# Patient Record
Sex: Male | Born: 1960 | Race: White | Hispanic: No | Marital: Married | Smoking: Never smoker
Health system: Southern US, Community
[De-identification: ages and names within clinical notes are randomized; demographics above are authoritative.]

## PROBLEM LIST (undated history)

## (undated) DIAGNOSIS — F319 Bipolar disorder, unspecified: Secondary | ICD-10-CM

## (undated) HISTORY — PX: FINGER SURGERY: SHX640

---

## 2004-11-02 ENCOUNTER — Emergency Department (HOSPITAL_COMMUNITY): Admission: EM | Admit: 2004-11-02 | Discharge: 2004-11-02 | Payer: Self-pay | Admitting: *Deleted

## 2004-11-02 IMAGING — CR DG FINGER THUMB 2+V*L*
1 series · 1 of 1 positions shown · non-contrast
Comparison: none

CLINICAL DATA: Football injury; thumb pain
 LEFT THUMB THREE VIEW:
 There is a comminuted fracture noted through the proximal phalanx of the left thumb.  Fracture does enter/involve the MCP joint.  Mild displacement of fracture fragments.

[view not recorded]
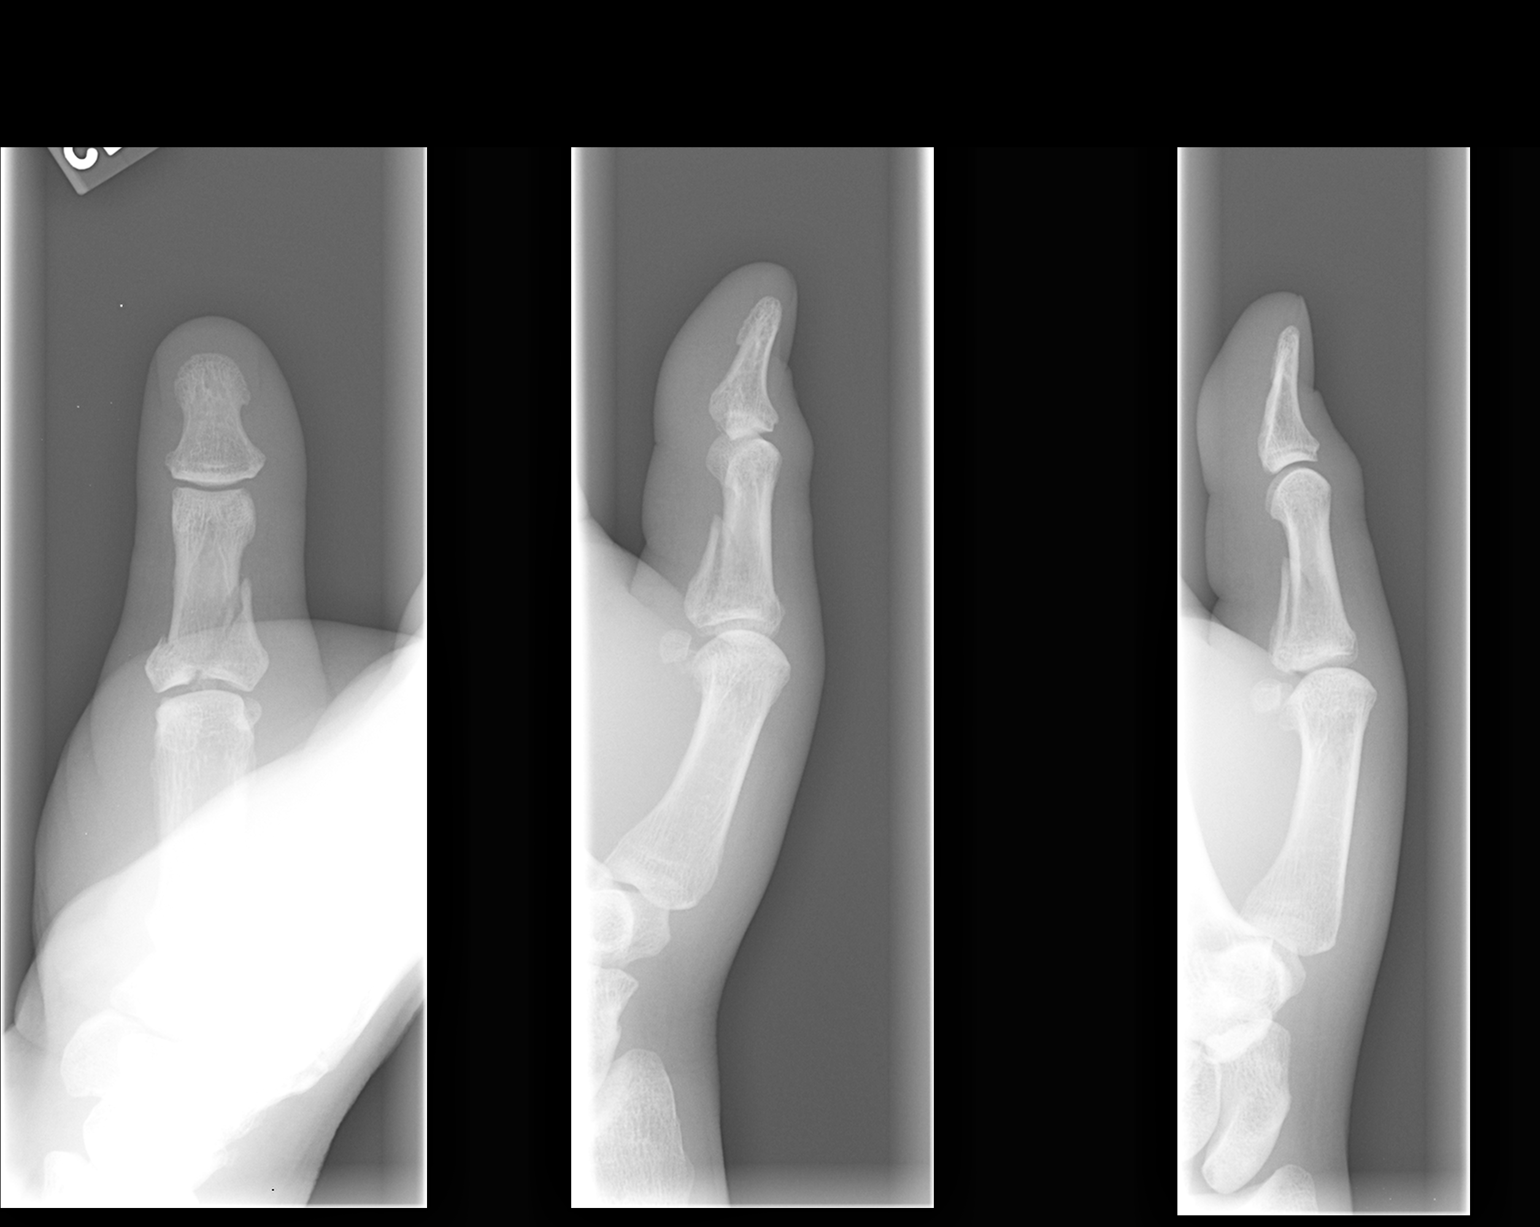

[1 of 1 positions shown; findings below may reference images not displayed]

IMPRESSION: Mildly displaced comminuted fracture proximal phalanx left thumb, entering/involving MCP joint.

## 2013-08-02 ENCOUNTER — Other Ambulatory Visit: Payer: Self-pay | Admitting: Physician Assistant

## 2013-08-02 DIAGNOSIS — N50812 Left testicular pain: Secondary | ICD-10-CM

## 2013-08-03 ENCOUNTER — Ambulatory Visit
Admission: RE | Admit: 2013-08-03 | Discharge: 2013-08-03 | Disposition: A | Discharge status: Home / Self Care | Payer: BC Managed Care – PPO | Source: Ambulatory Visit | Attending: Physician Assistant | Admitting: Physician Assistant

## 2013-08-03 DIAGNOSIS — N50812 Left testicular pain: Secondary | ICD-10-CM

## 2013-08-03 IMAGING — US US ART/VEN ABD/PELV/SCROTUM DOPPLER LTD
1 series · 14 of 25 positions shown · non-contrast
Comparison: None

CLINICAL DATA: Left testicular pain intermittently, no trauma

SCROTAL ULTRASOUND
DOPPLER ULTRASOUND OF THE TESTICLES
TECHNIQUE: Complete ultrasound examination of the testicles,
epididymis, and other scrotal structures was performed.  Color and
spectral Doppler ultrasound were also utilized to evaluate blood
flow to the testicles.

[Series 1: us art/ven abd/pelv/scrotum doppler ltd · 0.07mm/px · 14 of 48 slices shown]
[im 1/48]
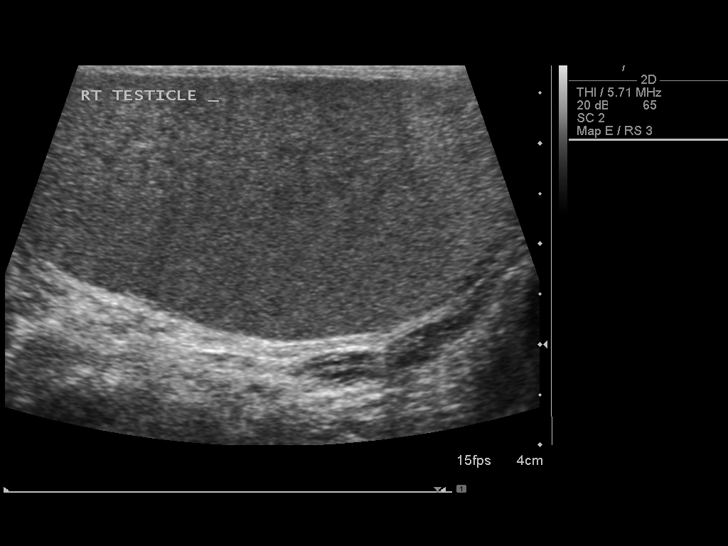
[im 4/48]
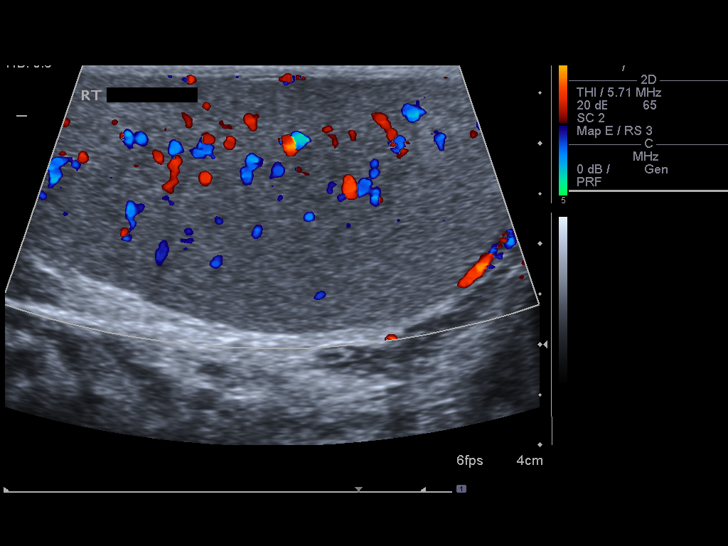
[im 8/48]
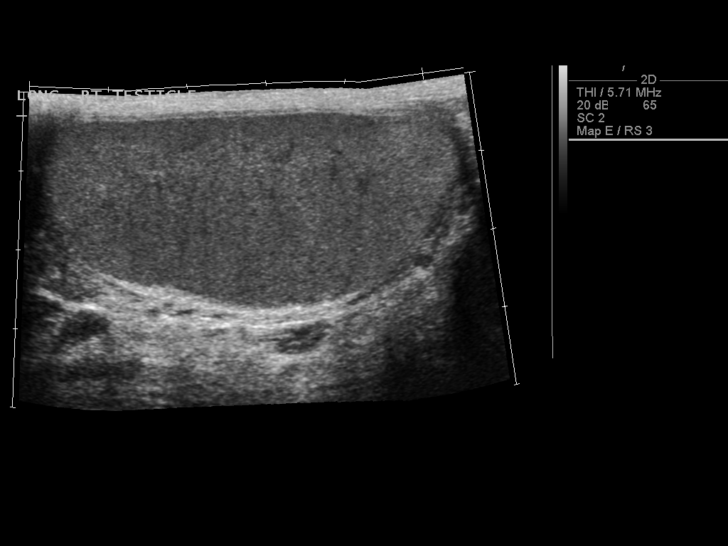
[im 12/48]
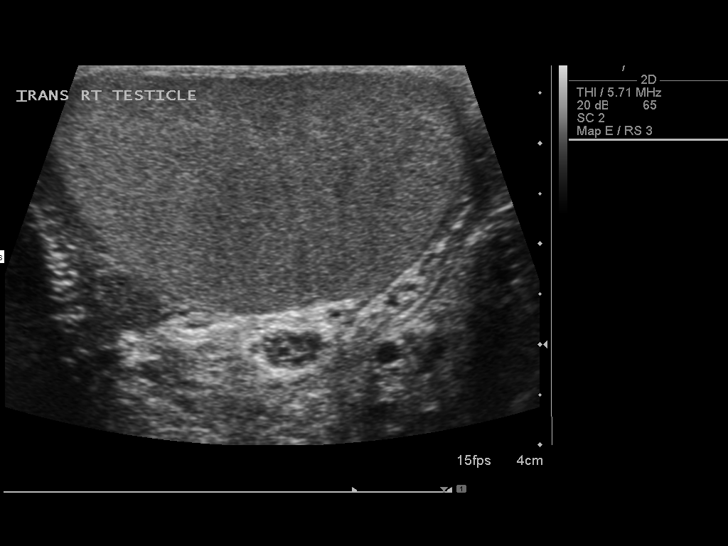
[im 16/48]
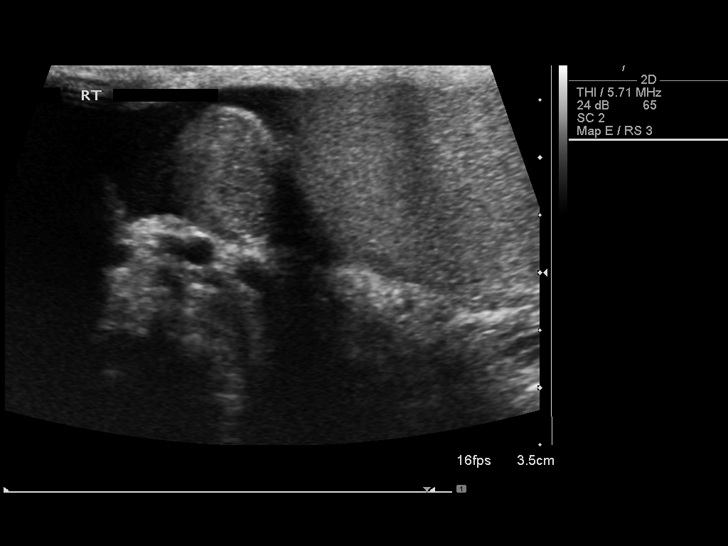
[im 18/48]
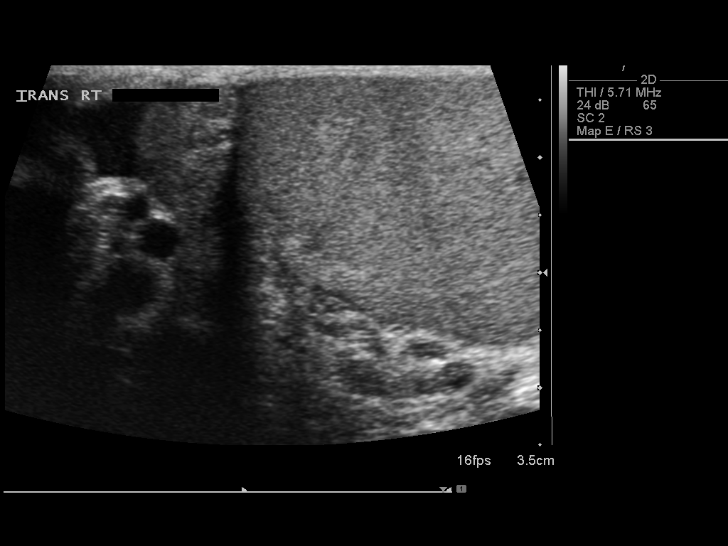
[im 22/48]
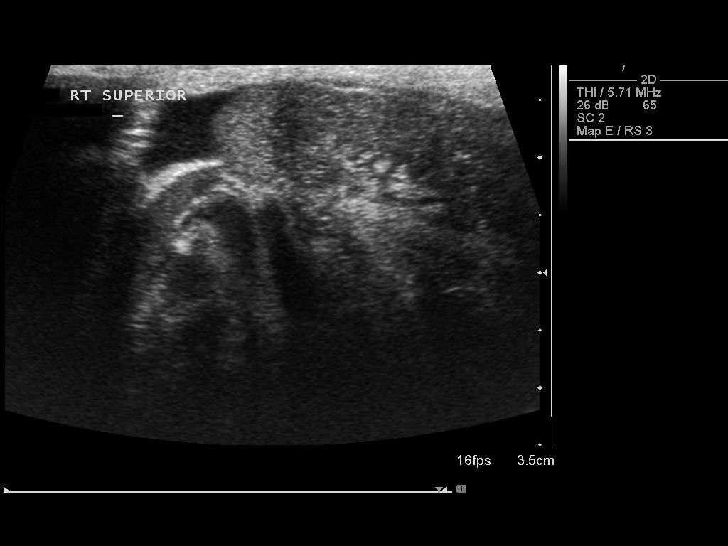
[im 26/48]
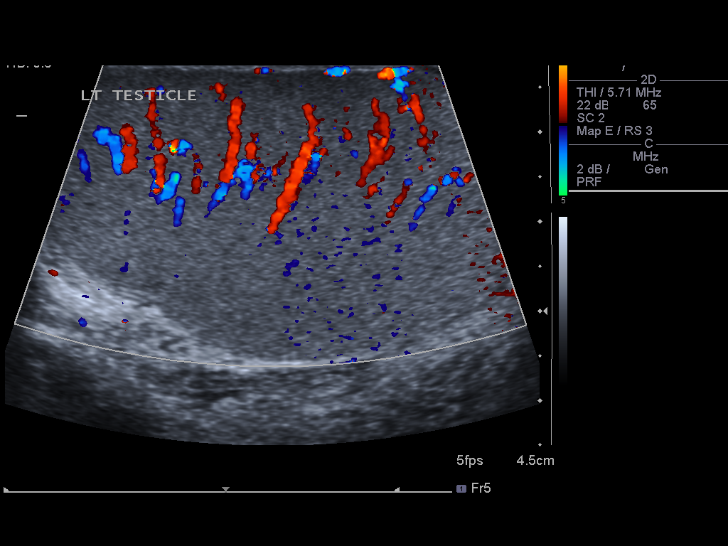
[im 30/48]
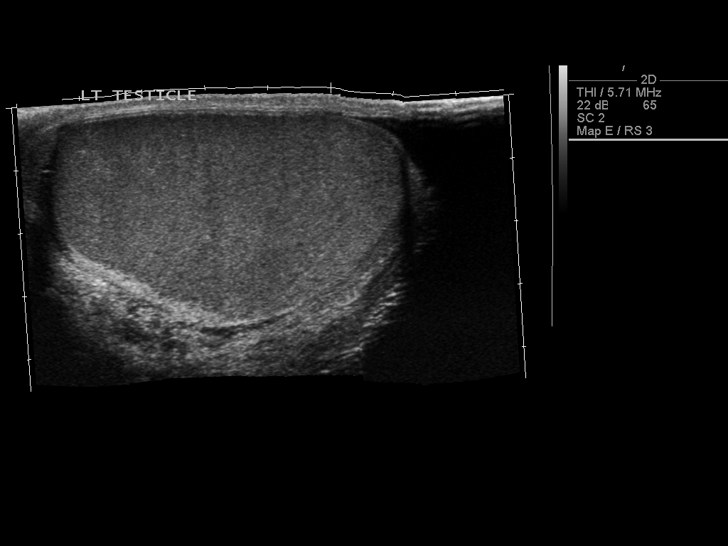
[im 32/48]
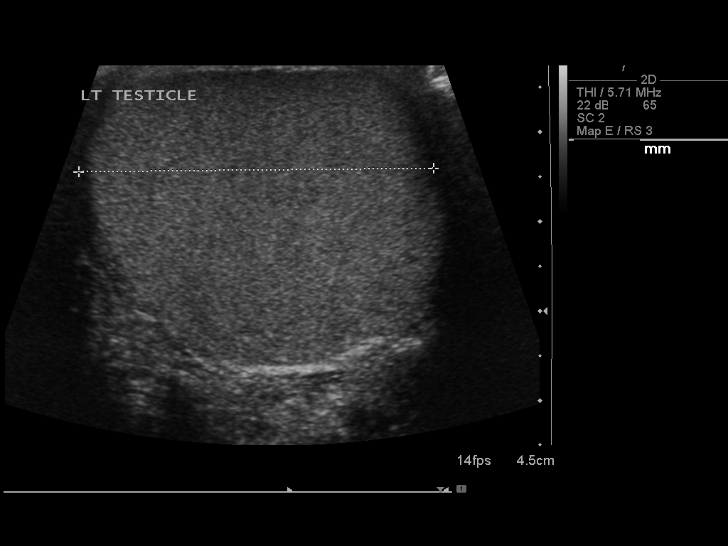
[im 36/48]
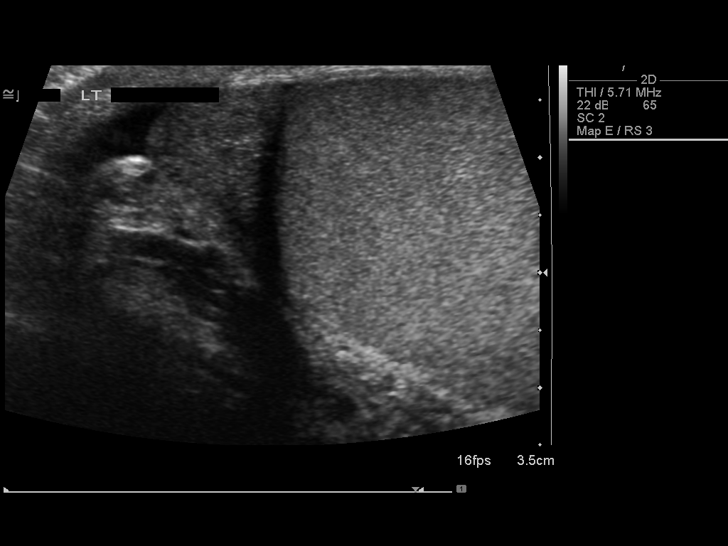
[im 40/48]
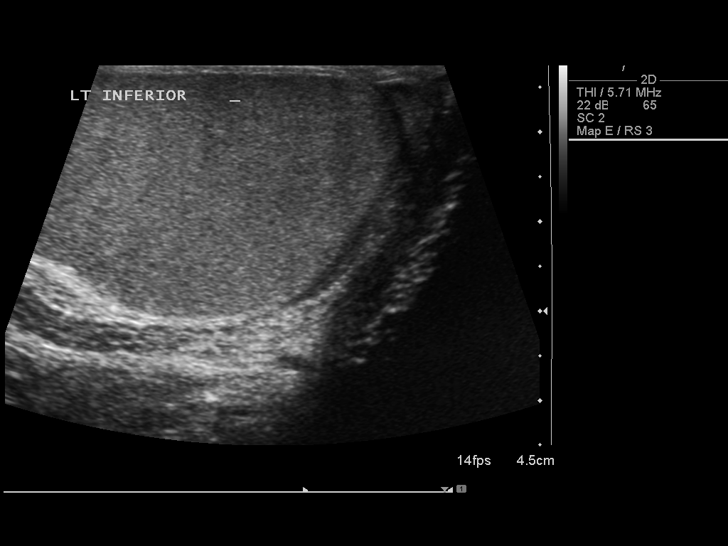
[im 44/48]
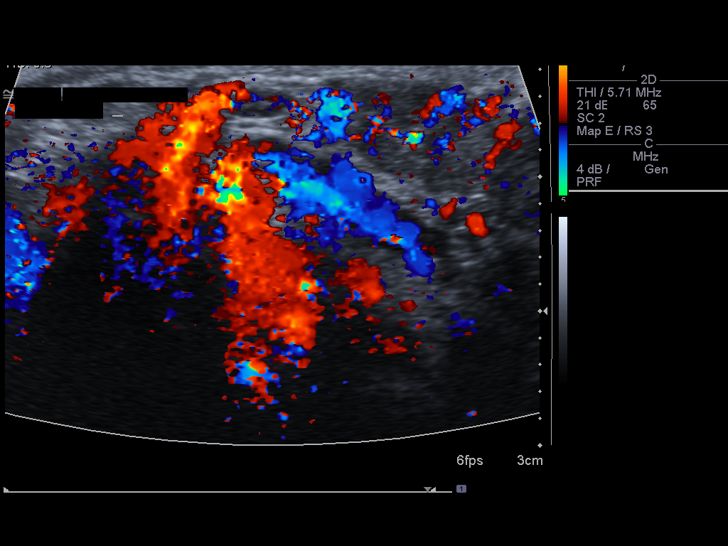
[im 48/48]
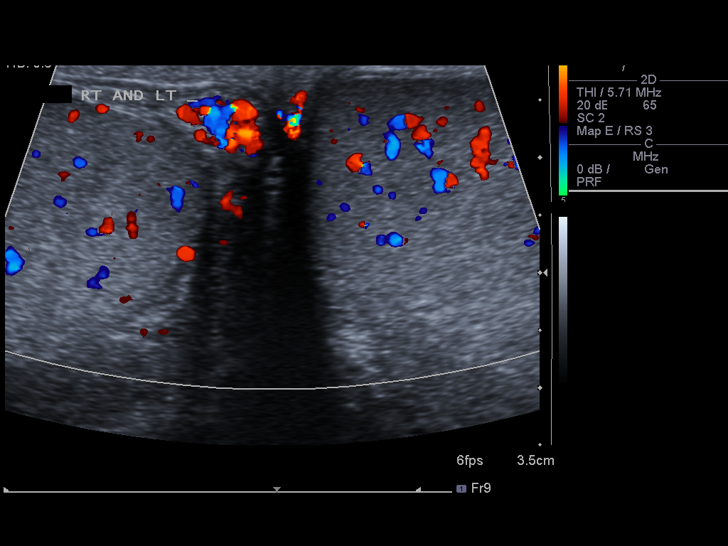

[14 of 25 positions shown; findings below may reference images not displayed]

FINDINGS: Right testis:  The right testicle is normal in size and
echogenicity.  Blood flow is demonstrated to the right testicle
with arterial and venous waveforms.

Left testis:  The left testicle is normal in size and in
echogenicity.  Blood flow is also demonstrated to the left testicle
with arterial and venous waveforms.

Right epididymis:  The right epididymis is unremarkable.

Left epididymis:  The left epididymis is unremarkable.

Hydrocele:  No hydrocele is seen.

Varicocele:  There is a mild right varicocele demonstrated.

Pulsed Doppler interrogation of both testes demonstrates low
resistance flow bilaterally.
IMPRESSION: 1.  Both testicles appear normal with blood flow demonstrated.
2.  Small right varicocele.

## 2014-10-05 ENCOUNTER — Encounter: Payer: Self-pay | Admitting: Gastroenterology

## 2014-12-05 ENCOUNTER — Encounter: Payer: BC Managed Care – PPO | Admitting: Gastroenterology

## 2016-07-30 DIAGNOSIS — J069 Acute upper respiratory infection, unspecified: Secondary | ICD-10-CM | POA: Diagnosis not present

## 2016-11-04 DIAGNOSIS — Z23 Encounter for immunization: Secondary | ICD-10-CM | POA: Diagnosis not present

## 2016-11-04 DIAGNOSIS — F3181 Bipolar II disorder: Secondary | ICD-10-CM | POA: Diagnosis not present

## 2017-02-11 DIAGNOSIS — H6122 Impacted cerumen, left ear: Secondary | ICD-10-CM | POA: Diagnosis not present

## 2017-02-11 DIAGNOSIS — R195 Other fecal abnormalities: Secondary | ICD-10-CM | POA: Diagnosis not present

## 2017-02-11 DIAGNOSIS — H66002 Acute suppurative otitis media without spontaneous rupture of ear drum, left ear: Secondary | ICD-10-CM | POA: Diagnosis not present

## 2017-06-15 DIAGNOSIS — F3181 Bipolar II disorder: Secondary | ICD-10-CM | POA: Diagnosis not present

## 2017-11-02 DIAGNOSIS — R5383 Other fatigue: Secondary | ICD-10-CM | POA: Diagnosis not present

## 2017-11-02 DIAGNOSIS — R4781 Slurred speech: Secondary | ICD-10-CM | POA: Diagnosis not present

## 2017-11-02 DIAGNOSIS — Z23 Encounter for immunization: Secondary | ICD-10-CM | POA: Diagnosis not present

## 2017-11-03 ENCOUNTER — Other Ambulatory Visit: Payer: Self-pay | Admitting: Physician Assistant

## 2017-11-03 DIAGNOSIS — R4781 Slurred speech: Secondary | ICD-10-CM

## 2017-11-09 ENCOUNTER — Inpatient Hospital Stay
Admission: RE | Admit: 2017-11-09 | Discharge: 2017-11-09 | Disposition: A | Discharge status: Home / Self Care | Payer: Self-pay | Source: Ambulatory Visit | Attending: Physician Assistant | Admitting: Physician Assistant

## 2017-12-15 DIAGNOSIS — F3181 Bipolar II disorder: Secondary | ICD-10-CM | POA: Diagnosis not present

## 2018-07-04 DIAGNOSIS — Z0000 Encounter for general adult medical examination without abnormal findings: Secondary | ICD-10-CM | POA: Diagnosis not present

## 2018-07-04 DIAGNOSIS — Z13220 Encounter for screening for lipoid disorders: Secondary | ICD-10-CM | POA: Diagnosis not present

## 2018-07-04 DIAGNOSIS — F3181 Bipolar II disorder: Secondary | ICD-10-CM | POA: Diagnosis not present

## 2018-07-04 DIAGNOSIS — R350 Frequency of micturition: Secondary | ICD-10-CM | POA: Diagnosis not present

## 2018-07-04 DIAGNOSIS — Z1159 Encounter for screening for other viral diseases: Secondary | ICD-10-CM | POA: Diagnosis not present

## 2018-09-12 DIAGNOSIS — Z23 Encounter for immunization: Secondary | ICD-10-CM | POA: Diagnosis not present

## 2018-10-26 DIAGNOSIS — J069 Acute upper respiratory infection, unspecified: Secondary | ICD-10-CM | POA: Diagnosis not present

## 2018-11-21 DIAGNOSIS — J209 Acute bronchitis, unspecified: Secondary | ICD-10-CM | POA: Diagnosis not present

## 2019-07-11 DIAGNOSIS — Z0000 Encounter for general adult medical examination without abnormal findings: Secondary | ICD-10-CM | POA: Diagnosis not present

## 2019-07-11 DIAGNOSIS — Z13220 Encounter for screening for lipoid disorders: Secondary | ICD-10-CM | POA: Diagnosis not present

## 2019-07-11 DIAGNOSIS — R350 Frequency of micturition: Secondary | ICD-10-CM | POA: Diagnosis not present

## 2019-07-11 DIAGNOSIS — F3181 Bipolar II disorder: Secondary | ICD-10-CM | POA: Diagnosis not present

## 2019-07-11 DIAGNOSIS — F419 Anxiety disorder, unspecified: Secondary | ICD-10-CM | POA: Diagnosis not present

## 2020-01-22 DIAGNOSIS — R413 Other amnesia: Secondary | ICD-10-CM | POA: Diagnosis not present

## 2020-01-22 DIAGNOSIS — F3181 Bipolar II disorder: Secondary | ICD-10-CM | POA: Diagnosis not present

## 2020-01-22 DIAGNOSIS — F419 Anxiety disorder, unspecified: Secondary | ICD-10-CM | POA: Diagnosis not present

## 2020-03-27 DIAGNOSIS — L570 Actinic keratosis: Secondary | ICD-10-CM | POA: Diagnosis not present

## 2020-08-22 DIAGNOSIS — R350 Frequency of micturition: Secondary | ICD-10-CM | POA: Diagnosis not present

## 2020-08-22 DIAGNOSIS — N410 Acute prostatitis: Secondary | ICD-10-CM | POA: Diagnosis not present

## 2020-09-10 DIAGNOSIS — F3181 Bipolar II disorder: Secondary | ICD-10-CM | POA: Diagnosis not present

## 2020-09-10 DIAGNOSIS — R413 Other amnesia: Secondary | ICD-10-CM | POA: Diagnosis not present

## 2020-09-10 DIAGNOSIS — F419 Anxiety disorder, unspecified: Secondary | ICD-10-CM | POA: Diagnosis not present

## 2020-09-10 DIAGNOSIS — R5383 Other fatigue: Secondary | ICD-10-CM | POA: Diagnosis not present

## 2021-12-05 DIAGNOSIS — F3181 Bipolar II disorder: Secondary | ICD-10-CM | POA: Diagnosis not present

## 2021-12-05 DIAGNOSIS — Z23 Encounter for immunization: Secondary | ICD-10-CM | POA: Diagnosis not present

## 2021-12-05 DIAGNOSIS — F419 Anxiety disorder, unspecified: Secondary | ICD-10-CM | POA: Diagnosis not present

## 2021-12-05 DIAGNOSIS — R413 Other amnesia: Secondary | ICD-10-CM | POA: Diagnosis not present

## 2021-12-05 DIAGNOSIS — R5383 Other fatigue: Secondary | ICD-10-CM | POA: Diagnosis not present

## 2021-12-05 DIAGNOSIS — Z125 Encounter for screening for malignant neoplasm of prostate: Secondary | ICD-10-CM | POA: Diagnosis not present

## 2022-02-18 ENCOUNTER — Other Ambulatory Visit: Payer: Self-pay

## 2022-02-18 ENCOUNTER — Encounter: Payer: Self-pay | Admitting: Emergency Medicine

## 2022-02-18 ENCOUNTER — Emergency Department
Admission: EM | Admit: 2022-02-18 | Discharge: 2022-02-18 | Disposition: A | Discharge status: Home / Self Care | Payer: BC Managed Care – PPO | Attending: Emergency Medicine | Admitting: Emergency Medicine

## 2022-02-18 ENCOUNTER — Emergency Department: Payer: BC Managed Care – PPO

## 2022-02-18 DIAGNOSIS — M545 Low back pain, unspecified: Secondary | ICD-10-CM | POA: Diagnosis not present

## 2022-02-18 DIAGNOSIS — J341 Cyst and mucocele of nose and nasal sinus: Secondary | ICD-10-CM | POA: Diagnosis not present

## 2022-02-18 DIAGNOSIS — S0003XA Contusion of scalp, initial encounter: Secondary | ICD-10-CM | POA: Diagnosis not present

## 2022-02-18 DIAGNOSIS — S199XXA Unspecified injury of neck, initial encounter: Secondary | ICD-10-CM | POA: Diagnosis not present

## 2022-02-18 DIAGNOSIS — M4602 Spinal enthesopathy, cervical region: Secondary | ICD-10-CM | POA: Diagnosis not present

## 2022-02-18 DIAGNOSIS — S060X1A Concussion with loss of consciousness of 30 minutes or less, initial encounter: Secondary | ICD-10-CM

## 2022-02-18 DIAGNOSIS — Y92410 Unspecified street and highway as the place of occurrence of the external cause: Secondary | ICD-10-CM | POA: Insufficient documentation

## 2022-02-18 DIAGNOSIS — R410 Disorientation, unspecified: Secondary | ICD-10-CM | POA: Diagnosis not present

## 2022-02-18 DIAGNOSIS — S161XXA Strain of muscle, fascia and tendon at neck level, initial encounter: Secondary | ICD-10-CM | POA: Insufficient documentation

## 2022-02-18 DIAGNOSIS — M546 Pain in thoracic spine: Secondary | ICD-10-CM | POA: Diagnosis not present

## 2022-02-18 DIAGNOSIS — M549 Dorsalgia, unspecified: Secondary | ICD-10-CM

## 2022-02-18 DIAGNOSIS — S39012A Strain of muscle, fascia and tendon of lower back, initial encounter: Secondary | ICD-10-CM | POA: Diagnosis not present

## 2022-02-18 DIAGNOSIS — M4692 Unspecified inflammatory spondylopathy, cervical region: Secondary | ICD-10-CM | POA: Diagnosis not present

## 2022-02-18 DIAGNOSIS — M47812 Spondylosis without myelopathy or radiculopathy, cervical region: Secondary | ICD-10-CM | POA: Diagnosis not present

## 2022-02-18 DIAGNOSIS — S0990XA Unspecified injury of head, initial encounter: Secondary | ICD-10-CM | POA: Diagnosis not present

## 2022-02-18 DIAGNOSIS — S060XAA Concussion with loss of consciousness status unknown, initial encounter: Secondary | ICD-10-CM | POA: Diagnosis not present

## 2022-02-18 DIAGNOSIS — I10 Essential (primary) hypertension: Secondary | ICD-10-CM | POA: Diagnosis not present

## 2022-02-18 HISTORY — DX: Bipolar disorder, unspecified: F31.9
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
## 2022-02-18 MED ORDER — HYDROCODONE-ACETAMINOPHEN 5-325 MG PO TABS: 1.0000 | ORAL_TABLET | Freq: Four times a day (QID) | ORAL | 0 refills | Status: DC | PRN
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
## 2022-02-20 ENCOUNTER — Telehealth: Payer: Self-pay | Admitting: Emergency Medicine

## 2022-02-20 DIAGNOSIS — R937 Abnormal findings on diagnostic imaging of other parts of musculoskeletal system: Secondary | ICD-10-CM | POA: Diagnosis not present

## 2022-02-20 DIAGNOSIS — S060XAD Concussion with loss of consciousness status unknown, subsequent encounter: Secondary | ICD-10-CM | POA: Diagnosis not present

## 2022-02-20 DIAGNOSIS — M549 Dorsalgia, unspecified: Secondary | ICD-10-CM | POA: Diagnosis not present

## 2022-02-20 MED ORDER — NAPROXEN 500 MG PO TABS: 500.0000 mg | ORAL_TABLET | Freq: Two times a day (BID) | ORAL | 2 refills | Status: DC

## 2022-02-20 NOTE — Telephone Encounter (Signed)
Pharmacy doesn't have med, will change ?

## 2022-02-23 ENCOUNTER — Other Ambulatory Visit: Payer: Self-pay | Admitting: Physician Assistant

## 2022-02-23 DIAGNOSIS — R937 Abnormal findings on diagnostic imaging of other parts of musculoskeletal system: Secondary | ICD-10-CM

## 2022-02-26 ENCOUNTER — Ambulatory Visit
Admission: RE | Admit: 2022-02-26 | Discharge: 2022-02-26 | Disposition: A | Discharge status: Home / Self Care | Payer: BC Managed Care – PPO | Source: Ambulatory Visit | Attending: Physician Assistant | Admitting: Physician Assistant

## 2022-02-26 ENCOUNTER — Other Ambulatory Visit: Payer: Self-pay

## 2022-02-26 DIAGNOSIS — R200 Anesthesia of skin: Secondary | ICD-10-CM | POA: Diagnosis not present

## 2022-02-26 DIAGNOSIS — M40204 Unspecified kyphosis, thoracic region: Secondary | ICD-10-CM | POA: Diagnosis not present

## 2022-02-26 IMAGING — MR MR THORACIC SPINE W/O CM
4 of 6 series · 23 of 48 positions shown · non-contrast
Comparison: None.

CLINICAL DATA: Pain between the scapula since [DATE]. Left arm
numbness and weakness.

EXAM:
MRI THORACIC SPINE WITHOUT CONTRAST
TECHNIQUE: Multiplanar, multisequence MR imaging of the thoracic spine was
performed. No intravenous contrast was administered.

[Series 17: T1 · sagittal · 3.0mm · 0.83mm/px · 5 of 15 slices shown]
[im 1/15]
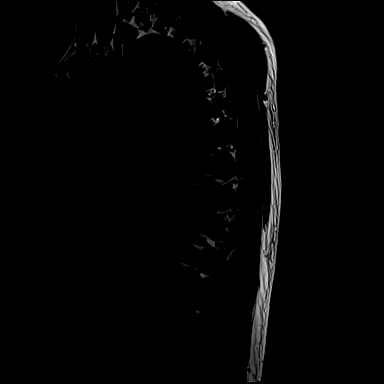
[im 4/15]
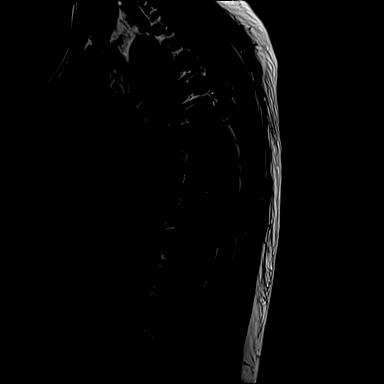
[im 8/15]
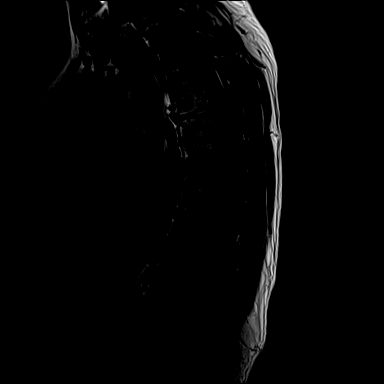
[im 11/15]
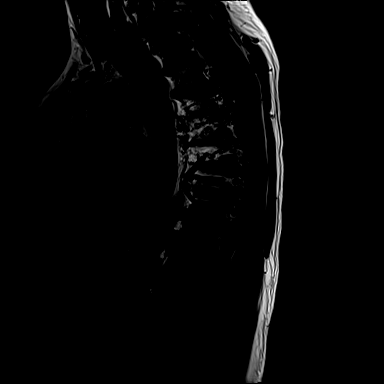
[im 15/15]
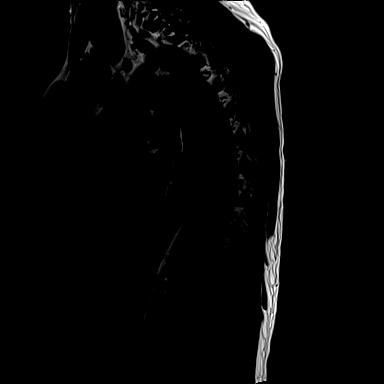

[Series 18: STIR · sagittal · 3.0mm · 1.00mm/px · 5 of 15 slices shown]
[im 1/15]
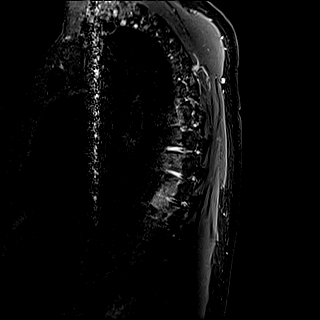
[im 4/15]
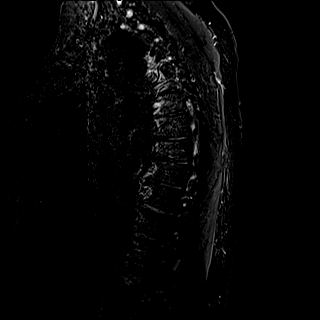
[im 8/15]
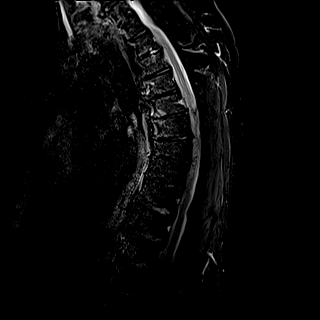
[im 11/15]
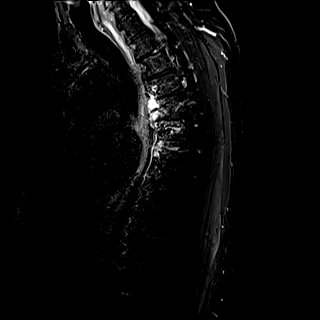
[im 15/15]
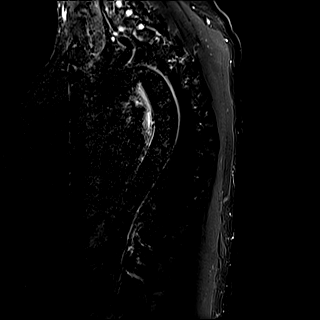

[Series 19: T2 · sagittal · 3.0mm · 0.83mm/px · 5 of 15 slices shown (1 of 2)]
[im 1/15]
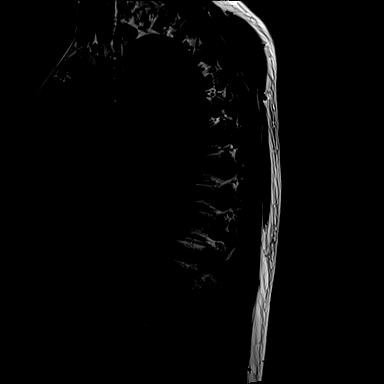
[im 4/15]
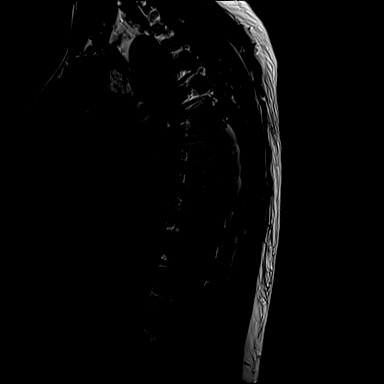
[im 8/15]
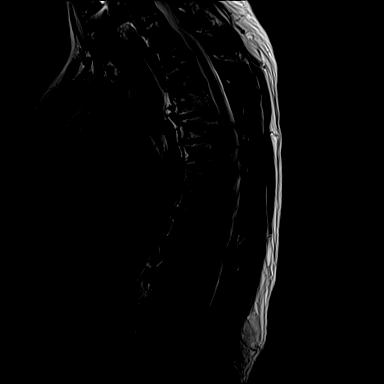
[im 11/15]
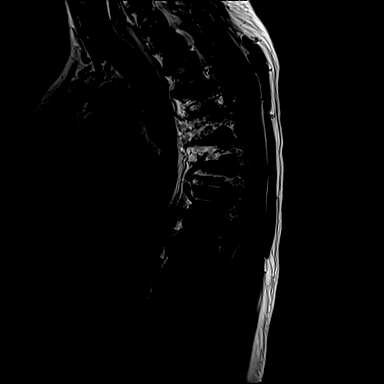
[im 15/15]
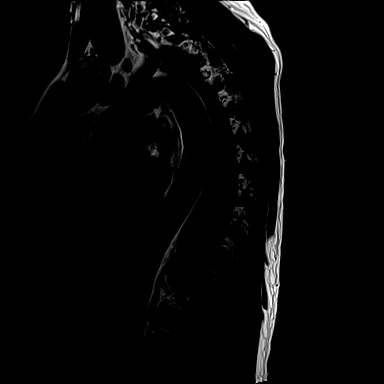

[Series 20: T2 · axial · 4.0mm · 0.28mm/px · z∈[-271,-56]mm · 8 of 39 slices shown (2 of 2)]
[im 1/39]
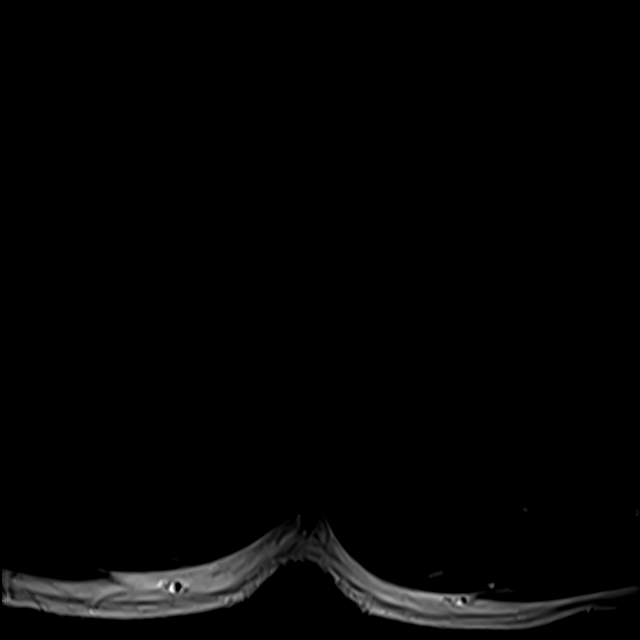
[im 6/39]
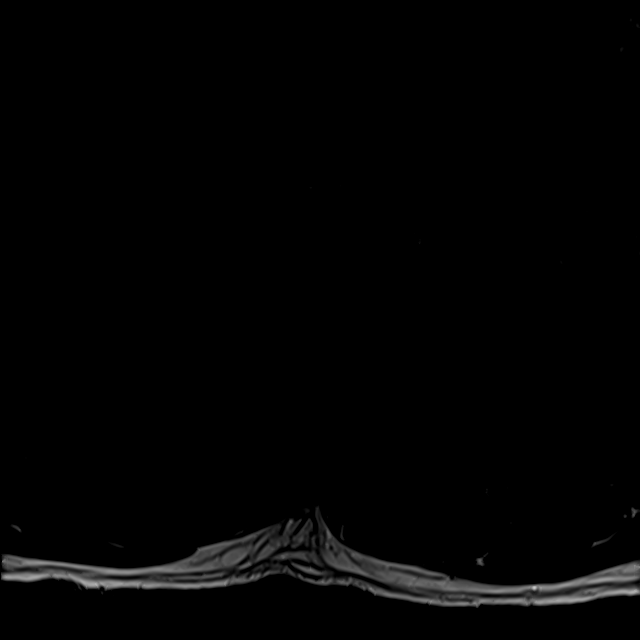
[im 12/39]
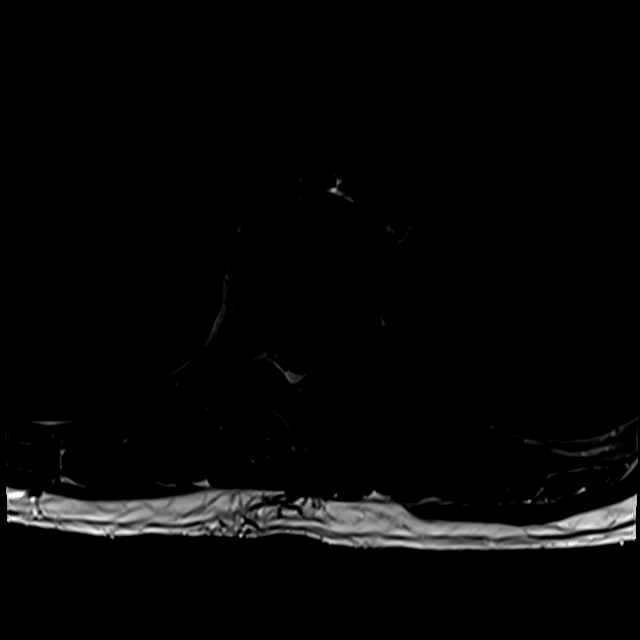
[im 18/39]
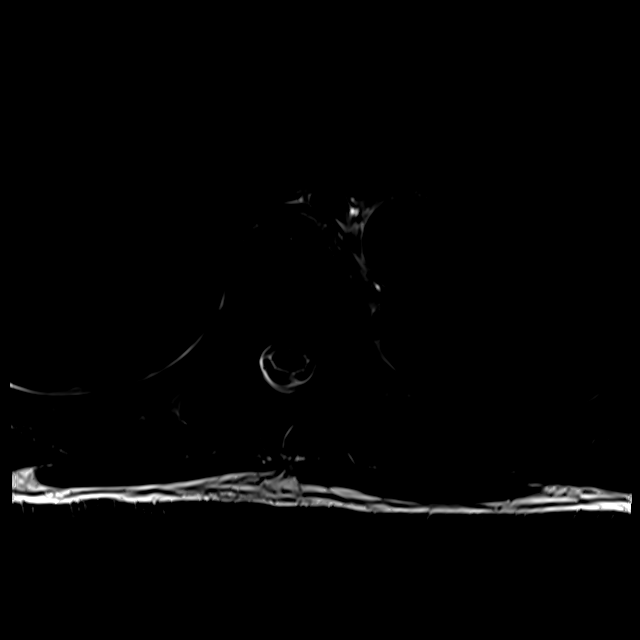
[im 21/39]
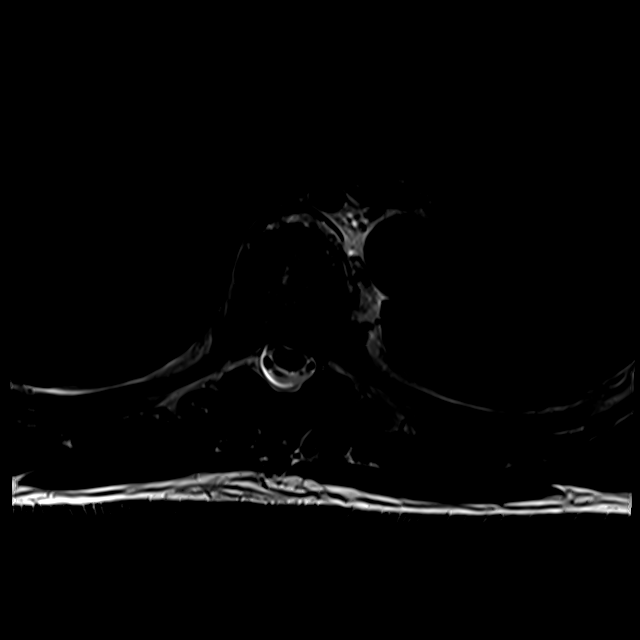
[im 27/39]
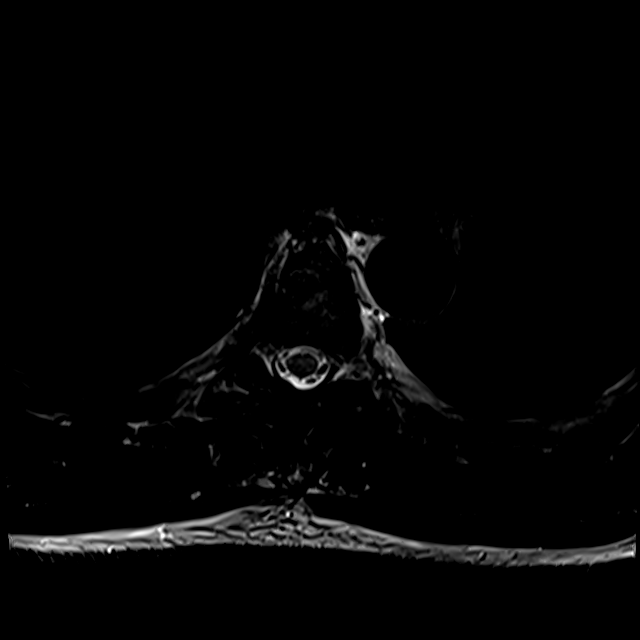
[im 33/39]
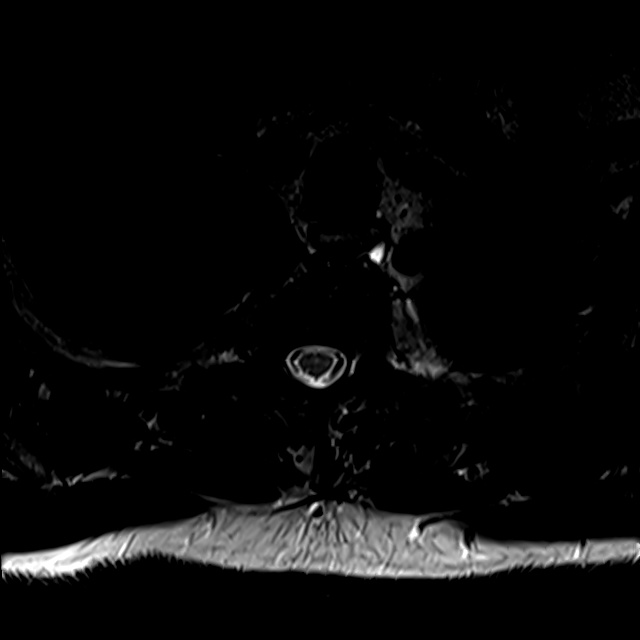
[im 39/39]
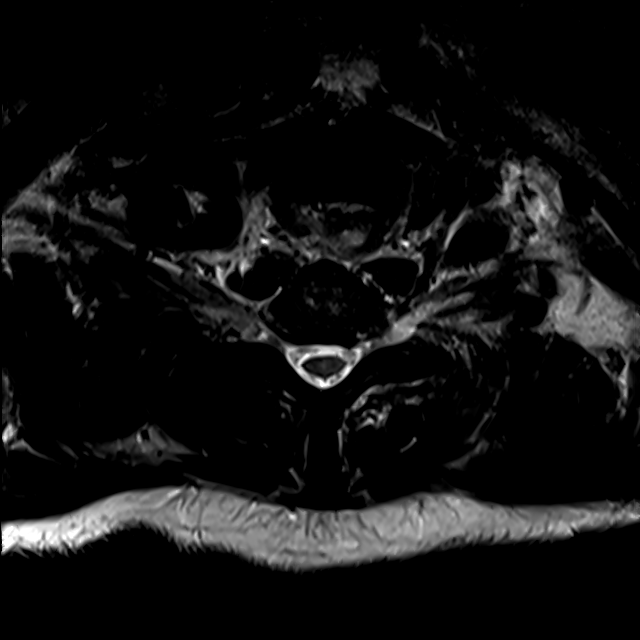

[23 of 48 positions shown; findings below may reference images not displayed]

FINDINGS: Alignment: Exaggerated thoracic kyphosis. No listhesis. Mild
dextrocurvature

Vertebrae: No fracture, evidence of discitis, or bone lesion.

Cord:  Normal signal and morphology.

Paraspinal and other soft tissues: Negative for perispinal mass or
inflammation.

Disc levels:

Generalized disc space narrowing greatest at the midthoracic levels
with mild endplate spurring. Small disc protrusions are seen
bilateral paracentral at T2-3, centrally at T3-4, and left
paracentral at T5-6 and T6-7. Mild upper thoracic facet spurring. No
cord compression or high-grade/focal foraminal narrowing.
IMPRESSION: Generalized thoracic degenerative change with exaggerated kyphosis
and mild dextrocurvature. No focal or high-grade neural impingement.
No visible inflammation.

## 2022-03-03 DIAGNOSIS — M546 Pain in thoracic spine: Secondary | ICD-10-CM | POA: Diagnosis not present

## 2022-03-03 DIAGNOSIS — M542 Cervicalgia: Secondary | ICD-10-CM | POA: Diagnosis not present

## 2022-03-03 DIAGNOSIS — M549 Dorsalgia, unspecified: Secondary | ICD-10-CM | POA: Diagnosis not present

## 2022-03-06 DIAGNOSIS — G44301 Post-traumatic headache, unspecified, intractable: Secondary | ICD-10-CM | POA: Diagnosis not present

## 2022-03-06 DIAGNOSIS — F0781 Postconcussional syndrome: Secondary | ICD-10-CM | POA: Diagnosis not present

## 2022-03-10 DIAGNOSIS — M542 Cervicalgia: Secondary | ICD-10-CM | POA: Diagnosis not present

## 2022-03-10 DIAGNOSIS — M549 Dorsalgia, unspecified: Secondary | ICD-10-CM | POA: Diagnosis not present

## 2022-03-10 DIAGNOSIS — M546 Pain in thoracic spine: Secondary | ICD-10-CM | POA: Diagnosis not present

## 2022-03-11 ENCOUNTER — Ambulatory Visit: Payer: BC Managed Care – PPO | Admitting: Family Medicine

## 2022-03-11 VITALS — BP 130/84 | HR 61 | Ht 72.0 in | Wt 216.2 lb

## 2022-03-11 DIAGNOSIS — R410 Disorientation, unspecified: Secondary | ICD-10-CM | POA: Diagnosis not present

## 2022-03-11 DIAGNOSIS — S060X0A Concussion without loss of consciousness, initial encounter: Secondary | ICD-10-CM | POA: Diagnosis not present

## 2022-03-11 MED ORDER — QUETIAPINE FUMARATE 50 MG PO TABS: 50.0000 mg | ORAL_TABLET | Freq: Every day | ORAL | 1 refills | Status: DC
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
## 2022-03-11 NOTE — Patient Instructions (Addendum)
Thank you for coming in today.  ? ?I've referred you to Speech Therapy.  Let us know if you don't hear from them in one week.  ? ?Recheck back in 3 weeks. ? ?

## 2022-03-12 DIAGNOSIS — M549 Dorsalgia, unspecified: Secondary | ICD-10-CM | POA: Diagnosis not present

## 2022-03-12 DIAGNOSIS — M546 Pain in thoracic spine: Secondary | ICD-10-CM | POA: Diagnosis not present

## 2022-03-12 DIAGNOSIS — M542 Cervicalgia: Secondary | ICD-10-CM | POA: Diagnosis not present

## 2022-03-16 ENCOUNTER — Encounter: Payer: Self-pay | Admitting: Speech Pathology

## 2022-03-16 ENCOUNTER — Ambulatory Visit: Payer: BC Managed Care – PPO | Attending: Family Medicine | Admitting: Speech Pathology

## 2022-03-16 DIAGNOSIS — R41841 Cognitive communication deficit: Secondary | ICD-10-CM | POA: Insufficient documentation
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
## 2022-03-17 DIAGNOSIS — M546 Pain in thoracic spine: Secondary | ICD-10-CM | POA: Diagnosis not present

## 2022-03-17 DIAGNOSIS — M549 Dorsalgia, unspecified: Secondary | ICD-10-CM | POA: Diagnosis not present

## 2022-03-17 DIAGNOSIS — M542 Cervicalgia: Secondary | ICD-10-CM | POA: Diagnosis not present
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
## 2022-03-19 ENCOUNTER — Encounter: Payer: Self-pay | Admitting: Speech Pathology

## 2022-03-19 ENCOUNTER — Ambulatory Visit: Payer: BC Managed Care – PPO | Admitting: Speech Pathology

## 2022-03-19 DIAGNOSIS — R41841 Cognitive communication deficit: Secondary | ICD-10-CM | POA: Diagnosis not present
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
## 2022-03-20 DIAGNOSIS — M546 Pain in thoracic spine: Secondary | ICD-10-CM | POA: Diagnosis not present

## 2022-03-20 DIAGNOSIS — M542 Cervicalgia: Secondary | ICD-10-CM | POA: Diagnosis not present

## 2022-03-20 DIAGNOSIS — M549 Dorsalgia, unspecified: Secondary | ICD-10-CM | POA: Diagnosis not present

## 2022-03-24 DIAGNOSIS — M546 Pain in thoracic spine: Secondary | ICD-10-CM | POA: Diagnosis not present

## 2022-03-24 DIAGNOSIS — M549 Dorsalgia, unspecified: Secondary | ICD-10-CM | POA: Diagnosis not present

## 2022-03-24 DIAGNOSIS — M542 Cervicalgia: Secondary | ICD-10-CM | POA: Diagnosis not present

## 2022-03-26 ENCOUNTER — Encounter: Payer: Self-pay | Admitting: Speech Pathology

## 2022-03-26 DIAGNOSIS — M542 Cervicalgia: Secondary | ICD-10-CM | POA: Diagnosis not present

## 2022-03-26 DIAGNOSIS — M546 Pain in thoracic spine: Secondary | ICD-10-CM | POA: Diagnosis not present

## 2022-03-26 DIAGNOSIS — M549 Dorsalgia, unspecified: Secondary | ICD-10-CM | POA: Diagnosis not present

## 2022-03-27 ENCOUNTER — Ambulatory Visit: Payer: BC Managed Care – PPO | Admitting: Speech Pathology

## 2022-03-27 ENCOUNTER — Encounter: Payer: Self-pay | Admitting: Speech Pathology

## 2022-03-27 DIAGNOSIS — R41841 Cognitive communication deficit: Secondary | ICD-10-CM | POA: Diagnosis not present

## 2022-03-27 NOTE — Therapy (Signed)
?OUTPATIENT SPEECH LANGUAGE PATHOLOGY TREATMENT NOTE ? ? ?Patient Name: Phillip Dodson ?MRN: [id number] ?DOB:1961-11-21, 61 y.o., male ?Today's Date: 03/27/2022 ? ?PCP: Elias Else, MD ?REFERRING PROVIDER: Elias Else, MD ? ?END OF SESSION:  ? End of Session - 03/27/22 1124   ? ? Visit Number 3   ? Number of Visits 17   ? Date for SLP Re-Evaluation 05/16/22   ? SLP Start Time 0845   ? SLP Stop Time  0930   ? SLP Time Calculation (min) 45 min   ? Activity Tolerance Patient tolerated treatment well   ? ?  ?  ? ?  ? ? ?Past Medical History:  ?Diagnosis Date  ? Bipolar 1 disorder ([facility])   ? ?Past Surgical History:  ?Procedure Laterality Date  ? FINGER SURGERY    ? ?There are no problems to display for this patient. ? ? ?ONSET DATE: 02/18/22 ? ?REFERRING DIAG: S06.0X0A (ICD-10-CM) - Concussion without loss of consciousness, initial encounter R41.0 (ICD-10-CM) - Confusion  ? ?THERAPY DIAG:  ?Cognitive communication deficit ? ?SUBJECTIVE: "I am really depressed." ? ?PAIN:  ?Are you having pain? No ?NPRS scale: 3/10 ?Pain location: shoulder blades  ?Pain orientation: Medial  ?PAIN TYPE: aching ?Pain description: intermittent  ?Aggravating factors: NA ?Relieving factors: Medication ? ? ? ?OBJECTIVE:  ? ?TODAY'S TREATMENT: ?Pt was seen for ST treatment with focus on cognitive-communication goals. Pt's wife was in attendance this AM. Pt reported they were more successful using calendars at home this week and this decreased anxiety and conversational breakdowns. Edu on communication strategies for wife and patient to assist in preventing other communication breakdowns. Pt and wife report they feel this would be helpful and are to choose 2 strategies to work on for the next week. Pt required occasional redirection to task this session. Paraphasia noted during conversation (office for kitchen) and pt was not aware. Pt reported he will be seeking mental health services and has been in touch with his psychologist. SLP provided pt  with relaxation after brain injury handout. Pt is to watch episode of Netflix, "Guide to Meditation" for HEP. SLP rec skilled ST services to continue addressing cog-comm impairment.  ? ?  ?PATIENT EDUCATION: ?Education details: cognitive-communication impairment ?Person educated: Patient ?Education method: Explanation, Demonstration, and Handouts ?Education comprehension: verbalized understanding, verbal cues required, and needs further education ?  ?  ?  ?  ?GOALS: ?Goals reviewed with patient? Yes ?  ?SHORT TERM GOALS: Target date: 04/14/2022 ?  ?Pt will recall 3 attention strategies to assist with optimization of focus during conversations given minA.  ?Baseline: 0 ?Goal status: INITIAL ?  ?2.  Pt will recall 3 memory strategies to assist with recall of important information given minA.  ?Baseline: 0 ?Goal status: INITIAL ?  ?  ?  ?LONG TERM GOALS: Target date: 05/12/2022 ?  ?Pt will report successful use of attention strategies to assist with optimization of focus during conversations independently.  ?Baseline:  ?Goal status: INITIAL ?  ?2.  Pt will report successful use of memory strategies to assist with recall of important information independently.  ?Baseline:  ?Goal status: INITIAL ?  ?3.  Pt will improve score on QoLIBRI by >10 points.  ?Baseline:  ?Goal status: INITIAL ?  ?  ?ASSESSMENT: ?  ?CLINICAL IMPRESSION: ?See tx note. Continue with current POC.  ?  ?  ?OBJECTIVE IMPAIRMENTS include attention and memory. These impairments are limiting patient from  cultivating and maintaining social relationships due to impact on communication, as well  as difficulty participating in his occupation . ?Factors affecting potential to achieve goals and functional outcome are  NA .Marland Kitchen Patient will benefit from skilled SLP services to address above impairments and improve overall function. ?  ?REHAB POTENTIAL: Good ?  ?PLAN: ?SLP FREQUENCY: 2x/week ?  ?SLP DURATION: 8 weeks ?  ?PLANNED INTERVENTIONS: Environmental controls,  Cueing hierachy, Cognitive reorganization, Internal/external aids, Functional tasks, SLP instruction and feedback, Compensatory strategies, and Patient/family education ? ?NEXT TREATMENT: edu on relaxation strategies and begin attention strategies. ?  ? ? ?Dorena Bodo, CCC-SLP ?03/27/2022, 11:25 AM ?  ?

## 2022-03-31 DIAGNOSIS — M542 Cervicalgia: Secondary | ICD-10-CM | POA: Diagnosis not present

## 2022-03-31 DIAGNOSIS — M546 Pain in thoracic spine: Secondary | ICD-10-CM | POA: Diagnosis not present

## 2022-03-31 DIAGNOSIS — M549 Dorsalgia, unspecified: Secondary | ICD-10-CM | POA: Diagnosis not present

## 2022-04-01 ENCOUNTER — Ambulatory Visit: Payer: BC Managed Care – PPO | Admitting: Family Medicine

## 2022-04-01 ENCOUNTER — Encounter: Payer: Self-pay | Admitting: Family Medicine

## 2022-04-01 VITALS — BP 120/80 | HR 66 | Ht 72.0 in | Wt 215.4 lb

## 2022-04-01 DIAGNOSIS — S060X0D Concussion without loss of consciousness, subsequent encounter: Secondary | ICD-10-CM | POA: Diagnosis not present

## 2022-04-01 DIAGNOSIS — R410 Disorientation, unspecified: Secondary | ICD-10-CM | POA: Diagnosis not present

## 2022-04-01 DIAGNOSIS — R41840 Attention and concentration deficit: Secondary | ICD-10-CM | POA: Diagnosis not present

## 2022-04-01 NOTE — Patient Instructions (Addendum)
Good to see you today. ? ?I've referred you to [facility]. Their office will call you to schedule but please let us know if you haven't heard from them in one week regarding scheduling. ? ?Follow-up: one month (30 min visit) ?
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
## 2022-04-02 DIAGNOSIS — M546 Pain in thoracic spine: Secondary | ICD-10-CM | POA: Diagnosis not present

## 2022-04-02 DIAGNOSIS — M542 Cervicalgia: Secondary | ICD-10-CM | POA: Diagnosis not present

## 2022-04-02 DIAGNOSIS — M549 Dorsalgia, unspecified: Secondary | ICD-10-CM | POA: Diagnosis not present

## 2022-04-03 ENCOUNTER — Ambulatory Visit: Payer: BC Managed Care – PPO | Admitting: Speech Pathology

## 2022-04-03 DIAGNOSIS — F4311 Post-traumatic stress disorder, acute: Secondary | ICD-10-CM | POA: Diagnosis not present

## 2022-04-03 DIAGNOSIS — R41841 Cognitive communication deficit: Secondary | ICD-10-CM | POA: Diagnosis not present

## 2022-04-03 NOTE — Therapy (Signed)
?OUTPATIENT SPEECH LANGUAGE PATHOLOGY TREATMENT NOTE ? ? ?Patient Name: Phillip Dodson ?MRN: [id number] ?DOB:14-Jul-1961, 61 y.o., male ?Today's Date: 04/03/2022 ? ?PCP: Elias Else, MD ?REFERRING PROVIDER: Elias Else, MD ? ?END OF SESSION:  ? End of Session - 04/03/22 1104   ? ? Visit Number 4   ? Number of Visits 17   ? Date for SLP Re-Evaluation 05/16/22   ? SLP Start Time [phone]   ? SLP Stop Time  0930   ? SLP Time Calculation (min) 41 min   ? Activity Tolerance Patient tolerated treatment well   ? ?  ?  ? ?  ? ? ?Past Medical History:  ?Diagnosis Date  ? Bipolar 1 disorder (HCC)   ? ?Past Surgical History:  ?Procedure Laterality Date  ? FINGER SURGERY    ? ?There are no problems to display for this patient. ? ? ?ONSET DATE: 02/18/22 ? ?REFERRING DIAG: S06.0X0A (ICD-10-CM) - Concussion without loss of consciousness, initial encounter R41.0 (ICD-10-CM) - Confusion  ? ?THERAPY DIAG:  ?Cognitive communication deficit ? ?SUBJECTIVE: "I'm talking too much, I'll stop." ? ?PAIN:  ?Are you having pain? No ?NPRS scale: 3/10 ?Pain location: shoulder blades  ?Pain orientation: Medial  ?PAIN TYPE: aching ?Pain description: intermittent  ?Aggravating factors: NA ?Relieving factors: Medication ? ? ? ?OBJECTIVE:  ? ?TODAY'S TREATMENT: ?Pt was seen for ST treatment with focus on cognitive-communication goals. Pt and wife report they have been working on communication strategies for wife and patient to assist in preventing other communication breakdowns.  Pt required occasional redirection to task this session. Began training on attention strategies to assist with optimization of focus during conversations. Discussed monitoring fatigue, creating a routine, and adding brain breaks to decrease cognitive load. Will cont. SLP rec skilled ST services to continue addressing cog-comm impairment.  ? ?  ?PATIENT EDUCATION: ?Education details: cognitive-communication impairment ?Person educated: Patient ?Education method: Explanation,  Demonstration, and Handouts ?Education comprehension: verbalized understanding, verbal cues required, and needs further education ?  ?  ?  ?  ?GOALS: ?Goals reviewed with patient? Yes ?  ?SHORT TERM GOALS: Target date: 04/14/2022 ?  ?Pt will recall 3 attention strategies to assist with optimization of focus during conversations given minA.  ?Baseline: 0 ?Goal status: INITIAL ?  ?2.  Pt will recall 3 memory strategies to assist with recall of important information given minA.  ?Baseline: 0 ?Goal status: INITIAL ?  ?  ?  ?LONG TERM GOALS: Target date: 05/12/2022 ?  ?Pt will report successful use of attention strategies to assist with optimization of focus during conversations independently.  ?Baseline:  ?Goal status: INITIAL ?  ?2.  Pt will report successful use of memory strategies to assist with recall of important information independently.  ?Baseline:  ?Goal status: INITIAL ?  ?3.  Pt will improve score on QoLIBRI by >10 points.  ?Baseline:  ?Goal status: INITIAL ?  ?  ?ASSESSMENT: ?  ?CLINICAL IMPRESSION: ?See tx note. Continue with current POC.  ?  ?  ?OBJECTIVE IMPAIRMENTS include attention and memory. These impairments are limiting patient from  cultivating and maintaining social relationships due to impact on communication, as well as difficulty participating in his occupation . ?Factors affecting potential to achieve goals and functional outcome are  NA .Marland Kitchen Patient will benefit from skilled SLP services to address above impairments and improve overall function. ?  ?REHAB POTENTIAL: Good ?  ?PLAN: ?SLP FREQUENCY: 2x/week ?  ?SLP DURATION: 8 weeks ?  ?PLANNED INTERVENTIONS: Environmental controls, Cueing hierachy,  Cognitive reorganization, Internal/external aids, Functional tasks, SLP instruction and feedback, Compensatory strategies, and Patient/family education ? ?NEXT TREATMENT: attention strategies ?  ? ? ?Dorena Bodo, CCC-SLP ?04/03/2022, 11:06 AM ? [place] [facility] ?[address] ?[place], [place], [address] ?Phone: [phone]   Fax:  [phone] ? ?Patient Details  ?Name: Phillip Dodson ?MRN: [id number] ?Date of Birth: 1961-02-06 ?Referring Provider:  Elias Else, MD ? ?Encounter Date: 04/03/2022 ? ? ?Dorena Bodo, CCC-SLP ?04/03/2022, 11:06 AM ? ?[place] ?[facility] ?[address] ?[place], [place], [address] ?Phone: [phone]   Fax:  [phone] ?

## 2022-04-06 DIAGNOSIS — N410 Acute prostatitis: Secondary | ICD-10-CM | POA: Diagnosis not present

## 2022-04-07 ENCOUNTER — Encounter: Payer: Self-pay | Admitting: Family Medicine

## 2022-04-07 DIAGNOSIS — S060X0D Concussion without loss of consciousness, subsequent encounter: Secondary | ICD-10-CM

## 2022-04-07 DIAGNOSIS — R41840 Attention and concentration deficit: Secondary | ICD-10-CM

## 2022-04-07 DIAGNOSIS — R410 Disorientation, unspecified: Secondary | ICD-10-CM

## 2022-04-09 DIAGNOSIS — M546 Pain in thoracic spine: Secondary | ICD-10-CM | POA: Diagnosis not present

## 2022-04-09 DIAGNOSIS — M549 Dorsalgia, unspecified: Secondary | ICD-10-CM | POA: Diagnosis not present

## 2022-04-09 DIAGNOSIS — M542 Cervicalgia: Secondary | ICD-10-CM | POA: Diagnosis not present

## 2022-04-10 ENCOUNTER — Encounter: Payer: Self-pay | Admitting: Speech Pathology

## 2022-04-10 ENCOUNTER — Ambulatory Visit: Payer: BC Managed Care – PPO | Admitting: Speech Pathology

## 2022-04-10 DIAGNOSIS — R41841 Cognitive communication deficit: Secondary | ICD-10-CM

## 2022-04-10 NOTE — Therapy (Signed)
?OUTPATIENT SPEECH LANGUAGE PATHOLOGY TREATMENT NOTE ? ? ?Patient Name: Phillip Dodson ?MRN: [id number] ?DOB:09-02-1961, 61 y.o., male ?Today's Date: 04/10/2022 ? ?PCP: Elias Else, MD ?REFERRING PROVIDER: Elias Else, MD ? ?END OF SESSION:  ? End of Session - 04/10/22 1010   ? ? Visit Number 5   ? Number of Visits 17   ? Date for SLP Re-Evaluation 05/16/22   ? SLP Start Time 0848   ? SLP Stop Time  0931   ? SLP Time Calculation (min) 43 min   ? Activity Tolerance Patient tolerated treatment well   ? ?  ?  ? ?  ? ? ?Past Medical History:  ?Diagnosis Date  ? Bipolar 1 disorder (HCC)   ? ?Past Surgical History:  ?Procedure Laterality Date  ? FINGER SURGERY    ? ?There are no problems to display for this patient. ? ? ?ONSET DATE: 02/18/22 ? ?REFERRING DIAG: S06.0X0A (ICD-10-CM) - Concussion without loss of consciousness, initial encounter R41.0 (ICD-10-CM) - Confusion  ? ?THERAPY DIAG:  ?Cognitive communication deficit ? ?SUBJECTIVE: "I'm talking too much, I'll stop." ? ?PAIN:  ?Are you having pain? No ?NPRS scale: 3/10 ?Pain location: shoulder blades  ?Pain orientation: Medial  ?PAIN TYPE: aching ?Pain description: intermittent  ?Aggravating factors: NA ?Relieving factors: Medication ? ? ? ?OBJECTIVE:  ? ?TODAY'S TREATMENT: ?04/10/22:  ?Pt seen for ST treatment with focus on cognitive-communication goals. Pt continues to report overwhelm with trying new strategies and routines; though, reportedly, pt has been making small changes at home with the help of his wife. Auditory comprehension continues to be difficult for Gunther and demonstrates a grimace when difficulty producing word and/or thought. Discussed the spoon theory and how cognitive load impacts our cognitive processes. Developed a plan with patient to move prep work to Sundays vs. Prepping during the week. Pt to trial this week.  ? ?Previous session: ?Pt was seen for ST treatment with focus on cognitive-communication goals. Pt and wife report they have been  working on communication strategies for wife and patient to assist in preventing other communication breakdowns.  Pt required occasional redirection to task this session. Began training on attention strategies to assist with optimization of focus during conversations. Discussed monitoring fatigue, creating a routine, and adding brain breaks to decrease cognitive load. Will cont. SLP rec skilled ST services to continue addressing cog-comm impairment.  ? ?  ?PATIENT EDUCATION: ?Education details: cognitive-communication impairment ?Person educated: Patient ?Education method: Explanation, Demonstration, and Handouts ?Education comprehension: verbalized understanding, verbal cues required, and needs further education ?  ?  ?  ?  ?GOALS: ?Goals reviewed with patient? Yes ?  ?SHORT TERM GOALS: Target date: 04/14/2022 ?  ?Pt will recall 3 attention strategies to assist with optimization of focus during conversations given minA.  ?Baseline: 0 ?Goal status: INITIAL ?  ?2.  Pt will recall 3 memory strategies to assist with recall of important information given minA.  ?Baseline: 0 ?Goal status: INITIAL ?  ?  ?  ?LONG TERM GOALS: Target date: 05/12/2022 ?  ?Pt will report successful use of attention strategies to assist with optimization of focus during conversations independently.  ?Baseline:  ?Goal status: INITIAL ?  ?2.  Pt will report successful use of memory strategies to assist with recall of important information independently.  ?Baseline:  ?Goal status: INITIAL ?  ?3.  Pt will improve score on QoLIBRI by >10 points.  ?Baseline:  ?Goal status: INITIAL ?  ?  ?ASSESSMENT: ?  ?CLINICAL IMPRESSION: ?See tx note.  Continue with current POC.  ?  ?  ?OBJECTIVE IMPAIRMENTS include attention and memory. These impairments are limiting patient from  cultivating and maintaining social relationships due to impact on communication, as well as difficulty participating in his occupation . ?Factors affecting potential to achieve goals and  functional outcome are  NA .Marland Kitchen Patient will benefit from skilled SLP services to address above impairments and improve overall function. ?  ?REHAB POTENTIAL: Good ?  ?PLAN: ?SLP FREQUENCY: 2x/week ?  ?SLP DURATION: 8 weeks ?  ?PLANNED INTERVENTIONS: Environmental controls, Cueing hierachy, Cognitive reorganization, Internal/external aids, Functional tasks, SLP instruction and feedback, Compensatory strategies, and Patient/family education ? ?  ? ? ?Dorena Bodo, CCC-SLP ?04/10/2022, 10:11 AM ? ?[place] ?[facility] ?[address] ?[place], [place], [address] ?Phone: [phone]   Fax:  [phone] ?

## 2022-04-12 ENCOUNTER — Ambulatory Visit: Payer: BC Managed Care – PPO

## 2022-04-13 ENCOUNTER — Ambulatory Visit (INDEPENDENT_AMBULATORY_CARE_PROVIDER_SITE_OTHER): Payer: BC Managed Care – PPO

## 2022-04-13 DIAGNOSIS — S060X0D Concussion without loss of consciousness, subsequent encounter: Secondary | ICD-10-CM

## 2022-04-13 DIAGNOSIS — R519 Headache, unspecified: Secondary | ICD-10-CM | POA: Diagnosis not present

## 2022-04-13 DIAGNOSIS — R41840 Attention and concentration deficit: Secondary | ICD-10-CM | POA: Diagnosis not present

## 2022-04-13 DIAGNOSIS — R410 Disorientation, unspecified: Secondary | ICD-10-CM
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
## 2022-04-14 DIAGNOSIS — M546 Pain in thoracic spine: Secondary | ICD-10-CM | POA: Diagnosis not present

## 2022-04-14 DIAGNOSIS — M549 Dorsalgia, unspecified: Secondary | ICD-10-CM | POA: Diagnosis not present

## 2022-04-14 DIAGNOSIS — M542 Cervicalgia: Secondary | ICD-10-CM | POA: Diagnosis not present

## 2022-04-14 NOTE — Progress Notes (Signed)
Brain MRI scan is normal.  This is good news but still does not rule out concussion.  It rules out bad things like strokes or brain tumors or bleeds.

## 2022-04-16 DIAGNOSIS — M549 Dorsalgia, unspecified: Secondary | ICD-10-CM | POA: Diagnosis not present

## 2022-04-16 DIAGNOSIS — M546 Pain in thoracic spine: Secondary | ICD-10-CM | POA: Diagnosis not present

## 2022-04-16 DIAGNOSIS — M542 Cervicalgia: Secondary | ICD-10-CM | POA: Diagnosis not present

## 2022-04-17 ENCOUNTER — Ambulatory Visit: Payer: BC Managed Care – PPO | Admitting: Speech Pathology

## 2022-04-17 DIAGNOSIS — F99 Mental disorder, not otherwise specified: Secondary | ICD-10-CM | POA: Diagnosis not present

## 2022-04-21 DIAGNOSIS — M542 Cervicalgia: Secondary | ICD-10-CM | POA: Diagnosis not present

## 2022-04-21 DIAGNOSIS — M546 Pain in thoracic spine: Secondary | ICD-10-CM | POA: Diagnosis not present

## 2022-04-21 DIAGNOSIS — M549 Dorsalgia, unspecified: Secondary | ICD-10-CM | POA: Diagnosis not present

## 2022-04-22 ENCOUNTER — Ambulatory Visit: Payer: BC Managed Care – PPO | Admitting: Family Medicine

## 2022-04-22 ENCOUNTER — Encounter: Payer: Self-pay | Admitting: Family Medicine

## 2022-04-22 VITALS — BP 120/80 | HR 60 | Ht 72.0 in | Wt 212.0 lb

## 2022-04-22 DIAGNOSIS — R41840 Attention and concentration deficit: Secondary | ICD-10-CM | POA: Diagnosis not present

## 2022-04-22 DIAGNOSIS — F3181 Bipolar II disorder: Secondary | ICD-10-CM | POA: Diagnosis not present

## 2022-04-22 DIAGNOSIS — R410 Disorientation, unspecified: Secondary | ICD-10-CM

## 2022-04-22 DIAGNOSIS — S060X0D Concussion without loss of consciousness, subsequent encounter: Secondary | ICD-10-CM

## 2022-04-22 MED ORDER — LAMOTRIGINE 200 MG PO TABS: 200.0000 mg | ORAL_TABLET | Freq: Every day | ORAL | 1 refills | Status: AC

## 2022-04-22 NOTE — Patient Instructions (Addendum)
Good to see you today. ? ?Follow-up (30 min appt): one month ?
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
## 2022-04-24 ENCOUNTER — Ambulatory Visit: Payer: BC Managed Care – PPO | Admitting: Speech Pathology

## 2022-04-24 DIAGNOSIS — M546 Pain in thoracic spine: Secondary | ICD-10-CM | POA: Diagnosis not present

## 2022-04-24 DIAGNOSIS — M549 Dorsalgia, unspecified: Secondary | ICD-10-CM | POA: Diagnosis not present

## 2022-04-24 DIAGNOSIS — M542 Cervicalgia: Secondary | ICD-10-CM | POA: Diagnosis not present

## 2022-04-25 DIAGNOSIS — H6693 Otitis media, unspecified, bilateral: Secondary | ICD-10-CM | POA: Diagnosis not present

## 2022-04-25 DIAGNOSIS — F319 Bipolar disorder, unspecified: Secondary | ICD-10-CM | POA: Diagnosis not present

## 2022-04-27 ENCOUNTER — Encounter: Payer: Self-pay | Admitting: Speech Pathology

## 2022-04-27 ENCOUNTER — Ambulatory Visit: Payer: BC Managed Care – PPO | Attending: Family Medicine | Admitting: Speech Pathology

## 2022-04-27 DIAGNOSIS — R41841 Cognitive communication deficit: Secondary | ICD-10-CM | POA: Diagnosis not present

## 2022-04-28 DIAGNOSIS — M542 Cervicalgia: Secondary | ICD-10-CM | POA: Diagnosis not present

## 2022-04-28 DIAGNOSIS — M549 Dorsalgia, unspecified: Secondary | ICD-10-CM | POA: Diagnosis not present

## 2022-04-28 DIAGNOSIS — M546 Pain in thoracic spine: Secondary | ICD-10-CM | POA: Diagnosis not present

## 2022-04-28 NOTE — Therapy (Signed)
?OUTPATIENT SPEECH LANGUAGE PATHOLOGY TREATMENT NOTE ? ? ?Patient Name: Phillip Dodson ?MRN: [id number] ?DOB:1961-12-12, 61 y.o., male ?Today's Date: 04/28/2022 ? ?PCP: Maury Dus, MD ?REFERRING PROVIDER: Maury Dus, MD ? ?END OF SESSION:  ? End of Session - 04/27/22 0854   ? ? Visit Number 6   ? Number of Visits 17   ? Date for SLP Re-Evaluation 05/16/22   ? SLP Start Time [id number]   ? SLP Stop Time  0930   ? SLP Time Calculation (min) 39 min   ? Activity Tolerance Patient tolerated treatment well   ? ?  ?  ? ?  ? ? ?Past Medical History:  ?Diagnosis Date  ? Bipolar 1 disorder ([place])   ? ?Past Surgical History:  ?Procedure Laterality Date  ? FINGER SURGERY    ? ?Patient Active Problem List  ? Diagnosis Date Noted  ? Bipolar 2 disorder ([place]) 04/22/2022  ? ? ?ONSET DATE: 02/18/22 ? ?REFERRING DIAG: S06.0X0A (ICD-10-CM) - Concussion without loss of consciousness, initial encounter R41.0 (ICD-10-CM) - Confusion  ? ?THERAPY DIAG:  ?Cognitive communication deficit ? ?SUBJECTIVE: "I'm talking too much, I'll stop." ? ?PAIN:  ?Are you having pain? No ?NPRS scale: 3/10 ?Pain location: shoulder blades  ?Pain orientation: Medial  ?PAIN TYPE: aching ?Pain description: intermittent  ?Aggravating factors: NA ?Relieving factors: Medication ? ? ? ?OBJECTIVE:  ? ?TODAY'S TREATMENT: ? ?04/10/22:  ?Pt seen for ST treatment with focus on cognitive-communication goals. Pt continues to report overwhelm with trying new strategies and routines; though, reportedly, pt has been making small changes at home with the help of his wife. Pt reported he trialed weekend prep x1 and reported it was helpful. He reported he forgot to do it this week, but will try again this weekend.  ? ?Pt reported he is having difficulties with his prostate which has been interrupting his sleep cycles. SLP suggested attempting deep breathing or substituting a calming activity to assist with returning to sleep. SLP also recommended pt discuss this with his psychologist  (as he reports this could be due to PTSD). Pt cont to have difficulty with auditory comprehension and benefits from extra repetitions and slowed rate of speech. Will cont.  ?  ?PATIENT EDUCATION: ?Education details: cognitive-communication impairment ?Person educated: Patient ?Education method: Explanation, Demonstration, and Handouts ?Education comprehension: verbalized understanding, verbal cues required, and needs further education ?  ?  ?  ?  ?GOALS: ?Goals reviewed with patient? Yes ?  ?SHORT TERM GOALS: Target date: 04/14/2022 ?  ?Pt will recall 3 attention strategies to assist with optimization of focus during conversations given minA.  ?Baseline: 0 ?Goal status: Ongoing ?  ?2.  Pt will recall 3 memory strategies to assist with recall of important information given minA.  ?Baseline: 0 ?Goal status: Ongoing ?  ?  ?  ?LONG TERM GOALS: Target date: 05/12/2022 ?  ?Pt will report successful use of attention strategies to assist with optimization of focus during conversations independently.  ?Baseline:  ?Goal status: INITIAL ?  ?2.  Pt will report successful use of memory strategies to assist with recall of important information independently.  ?Baseline:  ?Goal status: INITIAL ?  ?3.  Pt will improve score on QoLIBRI by >10 points.  ?Baseline:  ?Goal status: INITIAL ?  ?  ?ASSESSMENT: ?  ?CLINICAL IMPRESSION: ?See tx note. Continue with current POC.  ?  ?  ?OBJECTIVE IMPAIRMENTS include attention and memory. These impairments are limiting patient from  cultivating and maintaining social relationships due to  impact on communication, as well as difficulty participating in his occupation . ?Factors affecting potential to achieve goals and functional outcome are  NA .Marland Kitchen Patient will benefit from skilled SLP services to address above impairments and improve overall function. ?  ?REHAB POTENTIAL: Good ?  ?PLAN: ?SLP FREQUENCY: 2x/week ?  ?SLP DURATION: 8 weeks ?  ?PLANNED INTERVENTIONS: Environmental controls, Cueing  hierachy, Cognitive reorganization, Internal/external aids, Functional tasks, SLP instruction and feedback, Compensatory strategies, and Patient/family education ? ?  ? ? ?Verdene Lennert, [place] ?04/28/2022, 1:41 PM ? ?[place] [address] ?[place], [place], [address] ?Phone: [phone]   Fax:  [phone] ?

## 2022-05-01 DIAGNOSIS — M549 Dorsalgia, unspecified: Secondary | ICD-10-CM | POA: Diagnosis not present

## 2022-05-01 DIAGNOSIS — M542 Cervicalgia: Secondary | ICD-10-CM | POA: Diagnosis not present

## 2022-05-01 DIAGNOSIS — M546 Pain in thoracic spine: Secondary | ICD-10-CM | POA: Diagnosis not present

## 2022-05-04 ENCOUNTER — Ambulatory Visit: Payer: BC Managed Care – PPO | Admitting: Speech Pathology

## 2022-05-04 ENCOUNTER — Encounter: Payer: Self-pay | Admitting: Speech Pathology

## 2022-05-04 DIAGNOSIS — R41841 Cognitive communication deficit: Secondary | ICD-10-CM
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
## 2022-05-05 DIAGNOSIS — M542 Cervicalgia: Secondary | ICD-10-CM | POA: Diagnosis not present

## 2022-05-05 DIAGNOSIS — M546 Pain in thoracic spine: Secondary | ICD-10-CM | POA: Diagnosis not present

## 2022-05-05 DIAGNOSIS — M549 Dorsalgia, unspecified: Secondary | ICD-10-CM | POA: Diagnosis not present

## 2022-05-08 DIAGNOSIS — M542 Cervicalgia: Secondary | ICD-10-CM | POA: Diagnosis not present

## 2022-05-08 DIAGNOSIS — M549 Dorsalgia, unspecified: Secondary | ICD-10-CM | POA: Diagnosis not present

## 2022-05-08 DIAGNOSIS — M546 Pain in thoracic spine: Secondary | ICD-10-CM | POA: Diagnosis not present

## 2022-05-13 ENCOUNTER — Ambulatory Visit: Payer: BC Managed Care – PPO | Admitting: Speech Pathology

## 2022-05-13 DIAGNOSIS — R41841 Cognitive communication deficit: Secondary | ICD-10-CM | POA: Diagnosis not present

## 2022-05-13 NOTE — Therapy (Signed)
OUTPATIENT SPEECH LANGUAGE PATHOLOGY TREATMENT NOTE & RECERTIFICATION   Patient Name: Phillip Dodson MRN: [id number] DOB:1961-04-22, 61 y.o., male Today's Date: 05/13/2022  PCP: Maury Dus, MD REFERRING PROVIDER: Maury Dus, MD  END OF SESSION:   End of Session - 05/13/22 1625     Visit Number 8    Number of Visits 17    Date for SLP Re-Evaluation 07/13/22    SLP Start Time 0846    SLP Stop Time  0929    SLP Time Calculation (min) 43 min    Activity Tolerance Patient tolerated treatment well             Past Medical History:  Diagnosis Date   Bipolar 1 disorder ([place])    Past Surgical History:  Procedure Laterality Date   FINGER SURGERY     Patient Active Problem List   Diagnosis Date Noted   Bipolar 2 disorder ([place]) 04/22/2022    ONSET DATE: 02/18/22  REFERRING DIAG: E93.8B0F (ICD-10-CM) - Concussion without loss of consciousness, initial encounter R41.0 (ICD-10-CM) - Confusion   THERAPY DIAG:  Cognitive communication deficit  SUBJECTIVE: "I feel like I need more time between sessions to do my homework"  PAIN:  Are you having pain? No     OBJECTIVE:   TODAY'S TREATMENT:  05/13/22: Pt reports he has made improvements, but does not yet feel like himself. SLP facilitated today's session by continued education of strategies and relaxation. Pt processing appears to have sped up; however, SLP did note more paraphasias in today's session (phonemic). Pt reports he is using the following strategies successfully re: managing cognitive fatigue, counting spoons, ear plugs, notebook in pocket, meditation. He is continuing with his psychologist. Pt reports continued dizziness. SLP encouraged pt to speak to doc and/or PT regarding vestibular therapies. Pt expressed he felt with time commitments and going to [place] for a concussion treatment, he would benefit from 2 weeks in between sessions to implement strategies. SLP and pt in agreement. SLP to recertify  patient for additional 4 sessions (x2/month) to continue working on cognitive-communication and increase QOL.       PATIENT EDUCATION: Education details: cognitive-communication impairment Person educated: Patient Education method: Explanation, Demonstration, and Handouts Education comprehension: verbalized understanding, verbal cues required, and needs further education         GOALS: Goals reviewed with patient? Yes   SHORT TERM GOALS: Target date: 04/14/2022   Pt will recall 3 attention strategies to assist with optimization of focus during conversations given minA.  Baseline: 0 Goal status: MET   2.  Pt will recall 3 memory strategies to assist with recall of important information given minA.  Baseline: 0 Goal status: MET        LONG TERM GOALS: Target date: 07/13/22   Pt will report successful use of attention strategies to assist with optimization of focus during conversations independently.  Baseline:  Goal status: TO CONTINUE (not met on 5/31)    2.  Pt will report successful use of memory strategies to assist with recall of important information independently.  Baseline:  Goal status: TO CONTINUE (not met on 5/31)   3.  Pt will improve score on QoLIBRI by >10 points.  Baseline:  Goal status: TO CONTINUE (not met on 5/31)      ASSESSMENT:   CLINICAL IMPRESSION: See tx note. To recert patient for 4 additional sessions to maximize functional communication and independence.      OBJECTIVE IMPAIRMENTS include attention and memory. These impairments are  limiting patient from  cultivating and maintaining social relationships due to impact on communication, as well as difficulty participating in his occupation . Factors affecting potential to achieve goals and functional outcome are  NA .Marland Kitchen Patient will benefit from skilled SLP services to address above impairments and improve overall function.   REHAB POTENTIAL: Good   PLAN: SLP FREQUENCY: 2x/month   SLP  DURATION: 8 weeks   PLANNED INTERVENTIONS: Environmental controls, Cueing hierachy, Cognitive reorganization, Internal/external aids, Functional tasks, SLP instruction and feedback, Compensatory strategies, and Patient/family education      Verdene Lennert, CCC-SLP 05/13/2022, 4:26 PM  [place] [place], [place], [address] Phone: [phone]   Fax:  [phone]

## 2022-05-15 DIAGNOSIS — M546 Pain in thoracic spine: Secondary | ICD-10-CM | POA: Diagnosis not present

## 2022-05-15 DIAGNOSIS — M542 Cervicalgia: Secondary | ICD-10-CM | POA: Diagnosis not present

## 2022-05-15 DIAGNOSIS — M549 Dorsalgia, unspecified: Secondary | ICD-10-CM | POA: Diagnosis not present

## 2022-05-26 ENCOUNTER — Ambulatory Visit: Payer: BC Managed Care – PPO | Admitting: Speech Pathology

## 2022-05-27 ENCOUNTER — Ambulatory Visit (INDEPENDENT_AMBULATORY_CARE_PROVIDER_SITE_OTHER): Payer: BC Managed Care – PPO | Admitting: Family Medicine

## 2022-05-27 ENCOUNTER — Encounter: Payer: Self-pay | Admitting: Family Medicine

## 2022-05-27 VITALS — BP 128/80 | HR 64 | Ht 72.0 in | Wt 218.8 lb

## 2022-05-27 DIAGNOSIS — S060X0D Concussion without loss of consciousness, subsequent encounter: Secondary | ICD-10-CM

## 2022-05-27 DIAGNOSIS — R41840 Attention and concentration deficit: Secondary | ICD-10-CM | POA: Diagnosis not present

## 2022-05-27 DIAGNOSIS — R410 Disorientation, unspecified: Secondary | ICD-10-CM | POA: Diagnosis not present

## 2022-05-27 DIAGNOSIS — F3181 Bipolar II disorder: Secondary | ICD-10-CM

## 2022-05-27 NOTE — Patient Instructions (Addendum)
Good to see you today.  Behavioral health urgent care Phone: [phone] Address: [address], [place] [address] Hours: Open 24/7, No appointment required.   Please r/s your appt w/ the [facility]  Follow-up: one month (30 min)
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
## 2022-05-28 DIAGNOSIS — M546 Pain in thoracic spine: Secondary | ICD-10-CM | POA: Diagnosis not present

## 2022-05-28 DIAGNOSIS — M549 Dorsalgia, unspecified: Secondary | ICD-10-CM | POA: Diagnosis not present

## 2022-05-28 DIAGNOSIS — M542 Cervicalgia: Secondary | ICD-10-CM | POA: Diagnosis not present

## 2022-06-02 DIAGNOSIS — M549 Dorsalgia, unspecified: Secondary | ICD-10-CM | POA: Diagnosis not present

## 2022-06-02 DIAGNOSIS — M542 Cervicalgia: Secondary | ICD-10-CM | POA: Diagnosis not present

## 2022-06-02 DIAGNOSIS — M546 Pain in thoracic spine: Secondary | ICD-10-CM | POA: Diagnosis not present

## 2022-06-04 DIAGNOSIS — M546 Pain in thoracic spine: Secondary | ICD-10-CM | POA: Diagnosis not present

## 2022-06-04 DIAGNOSIS — M542 Cervicalgia: Secondary | ICD-10-CM | POA: Diagnosis not present

## 2022-06-04 DIAGNOSIS — M549 Dorsalgia, unspecified: Secondary | ICD-10-CM | POA: Diagnosis not present

## 2022-06-09 DIAGNOSIS — M546 Pain in thoracic spine: Secondary | ICD-10-CM | POA: Diagnosis not present

## 2022-06-09 DIAGNOSIS — M542 Cervicalgia: Secondary | ICD-10-CM | POA: Diagnosis not present

## 2022-06-09 DIAGNOSIS — M549 Dorsalgia, unspecified: Secondary | ICD-10-CM | POA: Diagnosis not present

## 2022-06-10 ENCOUNTER — Ambulatory Visit: Payer: BC Managed Care – PPO | Admitting: Speech Pathology

## 2022-06-10 DIAGNOSIS — F4311 Post-traumatic stress disorder, acute: Secondary | ICD-10-CM | POA: Diagnosis not present

## 2022-06-11 DIAGNOSIS — M546 Pain in thoracic spine: Secondary | ICD-10-CM | POA: Diagnosis not present

## 2022-06-11 DIAGNOSIS — M549 Dorsalgia, unspecified: Secondary | ICD-10-CM | POA: Diagnosis not present

## 2022-06-11 DIAGNOSIS — M542 Cervicalgia: Secondary | ICD-10-CM | POA: Diagnosis not present

## 2022-06-15 DIAGNOSIS — M549 Dorsalgia, unspecified: Secondary | ICD-10-CM | POA: Diagnosis not present

## 2022-06-15 DIAGNOSIS — M546 Pain in thoracic spine: Secondary | ICD-10-CM | POA: Diagnosis not present

## 2022-06-15 DIAGNOSIS — M542 Cervicalgia: Secondary | ICD-10-CM | POA: Diagnosis not present

## 2022-06-17 DIAGNOSIS — R413 Other amnesia: Secondary | ICD-10-CM | POA: Diagnosis not present

## 2022-06-17 DIAGNOSIS — F419 Anxiety disorder, unspecified: Secondary | ICD-10-CM | POA: Diagnosis not present

## 2022-06-17 DIAGNOSIS — R5383 Other fatigue: Secondary | ICD-10-CM | POA: Diagnosis not present

## 2022-06-17 DIAGNOSIS — F3181 Bipolar II disorder: Secondary | ICD-10-CM | POA: Diagnosis not present
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
## 2022-06-19 ENCOUNTER — Encounter: Payer: Self-pay | Admitting: Speech Pathology

## 2022-06-19 ENCOUNTER — Ambulatory Visit: Payer: BC Managed Care – PPO | Attending: Family Medicine | Admitting: Speech Pathology

## 2022-06-19 DIAGNOSIS — R41841 Cognitive communication deficit: Secondary | ICD-10-CM | POA: Insufficient documentation

## 2022-06-19 NOTE — Therapy (Signed)
OUTPATIENT SPEECH LANGUAGE PATHOLOGY TREATMENT NOTE & DISCHARGE SUMMARY   Patient Name: Phillip Dodson MRN: [id number] DOB:10-26-1961, 61 y.o., male Today's Date: 06/19/2022  PCP: Maury Dus, MD REFERRING PROVIDER: Maury Dus, MD  SPEECH THERAPY DISCHARGE SUMMARY  Visits from Start of Care: 9  Current functional level related to goals / functional outcomes: See previous tx notes. Completed QoLIBRI today. Pt score improved from 101 to 115.    Remaining deficits: Memory, attention   Education / Equipment: Completed   Patient agrees to discharge. Patient goals were met. Patient is being discharged due to patient being pleased with the current functional level..     END OF SESSION:   End of Session - 06/19/22 1117     Visit Number 9    Number of Visits 17    Date for SLP Re-Evaluation 07/13/22    SLP Start Time 44    SLP Stop Time  1140    SLP Time Calculation (min) 40 min    Activity Tolerance Patient tolerated treatment well             Past Medical History:  Diagnosis Date   Bipolar 1 disorder ([place])    Past Surgical History:  Procedure Laterality Date   FINGER SURGERY     Patient Active Problem List   Diagnosis Date Noted   Bipolar 2 disorder ([place]) 04/22/2022    ONSET DATE: 02/18/22  REFERRING DIAG: R71.1A5B (ICD-10-CM) - Concussion without loss of consciousness, initial encounter R41.0 (ICD-10-CM) - Confusion   THERAPY DIAG:  Cognitive communication deficit  SUBJECTIVE: "I feel like I need more time between sessions to do my homework"  PAIN:  Are you having pain? No     OBJECTIVE:   TODAY'S TREATMENT:   06/19/22: Pt returned today after previously wishing to discontinue services due to improvement. Pt reported he would like to follow up with me to let me know how he was doing and further explain his readiness to discharge. Pt reports he has made gains in all areas of life, but still reports some difficulty attention and memory. SLP  provided pt with memory strategies this session and reviewed current systems pt has in place for attention. He reports success with all strategies at home. Completed QoLIBRI. Pt increased score from 101 to 115 indicating improvements in feelings regarding brain injury. SLP to officially d/c patient today.    05/13/22: Pt reports he has made improvements, but does not yet feel like himself. SLP facilitated today's session by continued education of strategies and relaxation. Pt processing appears to have sped up; however, SLP did note more paraphasias in today's session (phonemic). Pt reports he is using the following strategies successfully re: managing cognitive fatigue, counting spoons, ear plugs, notebook in pocket, meditation. He is continuing with his psychologist. Pt reports continued dizziness. SLP encouraged pt to speak to doc and/or PT regarding vestibular therapies. Pt expressed he felt with time commitments and going to [place] for a concussion treatment, he would benefit from 2 weeks in between sessions to implement strategies. SLP and pt in agreement. SLP to recertify patient for additional 4 sessions (x2/month) to continue working on cognitive-communication and increase QOL.       PATIENT EDUCATION: Education details: cognitive-communication impairment Person educated: Patient Education method: Explanation, Demonstration, and Handouts Education comprehension: verbalized understanding, verbal cues required, and needs further education         GOALS: Goals reviewed with patient? Yes   SHORT TERM GOALS: Target date: 04/14/2022  Pt will recall 3 attention strategies to assist with optimization of focus during conversations given minA.  Baseline: 0 Goal status: MET   2.  Pt will recall 3 memory strategies to assist with recall of important information given minA.  Baseline: 0 Goal status: MET        LONG TERM GOALS: Target date: 07/13/22   Pt will report successful use of  attention strategies to assist with optimization of focus during conversations independently.  Baseline:  Goal status: MET    2.  Pt will report successful use of memory strategies to assist with recall of important information independently.  Baseline:  Goal status: NOT MET (but were provided and pt reports he uses some external strategies at home.    3.  Pt will improve score on QoLIBRI by >10 points.  Baseline:  Goal status: MET     ASSESSMENT:   CLINICAL IMPRESSION: See tx note. To discharge pt due to pt satisfaction with symptoms.     OBJECTIVE IMPAIRMENTS include attention and memory. These impairments are limiting patient from  cultivating and maintaining social relationships due to impact on communication, as well as difficulty participating in his occupation . Factors affecting potential to achieve goals and functional outcome are  NA .Marland Kitchen Patient will benefit from skilled SLP services to address above impairments and improve overall function.   REHAB POTENTIAL: Good   PLAN: SLP FREQUENCY: 2x/month   SLP DURATION: 8 weeks   PLANNED INTERVENTIONS: Environmental controls, Cueing hierachy, Cognitive reorganization, Internal/external aids, Functional tasks, SLP instruction and feedback, Compensatory strategies, and Patient/family education      Verdene Lennert, CCC-SLP 06/19/2022, 11:17 AM  [address]. [place], [place], [address] Phone: [phone]   Fax:  [phone]

## 2022-06-22 ENCOUNTER — Encounter: Payer: Self-pay | Admitting: Family Medicine

## 2022-06-22 ENCOUNTER — Ambulatory Visit (INDEPENDENT_AMBULATORY_CARE_PROVIDER_SITE_OTHER): Payer: BC Managed Care – PPO | Admitting: Family Medicine

## 2022-06-22 VITALS — BP 100/76 | HR 60 | Ht 72.0 in | Wt 214.6 lb

## 2022-06-22 DIAGNOSIS — R410 Disorientation, unspecified: Secondary | ICD-10-CM | POA: Diagnosis not present

## 2022-06-22 DIAGNOSIS — S060X0D Concussion without loss of consciousness, subsequent encounter: Secondary | ICD-10-CM | POA: Diagnosis not present

## 2022-06-22 DIAGNOSIS — R41840 Attention and concentration deficit: Secondary | ICD-10-CM

## 2022-06-22 DIAGNOSIS — F4311 Post-traumatic stress disorder, acute: Secondary | ICD-10-CM | POA: Diagnosis not present

## 2022-06-22 NOTE — Patient Instructions (Addendum)
Good to see you today.  Let me know when you want a letter stating maximum medical improvement.  Follow-up: as needed

## 2022-06-23 ENCOUNTER — Ambulatory Visit: Payer: BC Managed Care – PPO | Admitting: Speech Pathology

## 2022-06-23 ENCOUNTER — Encounter: Payer: Self-pay | Admitting: Family Medicine

## 2022-06-24 ENCOUNTER — Encounter: Payer: Self-pay | Admitting: Family Medicine
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
## 2022-07-08 ENCOUNTER — Ambulatory Visit: Payer: BC Managed Care – PPO | Admitting: Speech Pathology

## 2023-01-11 DIAGNOSIS — F3181 Bipolar II disorder: Secondary | ICD-10-CM | POA: Diagnosis not present

## 2023-01-11 DIAGNOSIS — F419 Anxiety disorder, unspecified: Secondary | ICD-10-CM | POA: Diagnosis not present

## 2023-01-11 DIAGNOSIS — R5383 Other fatigue: Secondary | ICD-10-CM | POA: Diagnosis not present

## 2023-01-11 DIAGNOSIS — G479 Sleep disorder, unspecified: Secondary | ICD-10-CM | POA: Diagnosis not present

## 2023-03-22 DIAGNOSIS — R350 Frequency of micturition: Secondary | ICD-10-CM | POA: Diagnosis not present

## 2023-03-22 DIAGNOSIS — R3915 Urgency of urination: Secondary | ICD-10-CM | POA: Diagnosis not present

## 2023-03-30 ENCOUNTER — Ambulatory Visit: Payer: BC Managed Care – PPO | Admitting: Family Medicine

## 2023-03-30 ENCOUNTER — Encounter: Payer: Self-pay | Admitting: Family Medicine

## 2023-03-30 VITALS — BP 148/82 | HR 85 | Wt 217.0 lb

## 2023-03-30 DIAGNOSIS — R41840 Attention and concentration deficit: Secondary | ICD-10-CM

## 2023-03-30 DIAGNOSIS — S060X0D Concussion without loss of consciousness, subsequent encounter: Secondary | ICD-10-CM

## 2023-03-30 MED ORDER — AMPHETAMINE-DEXTROAMPHETAMINE 10 MG PO TABS: 10.0000 mg | ORAL_TABLET | Freq: Two times a day (BID) | ORAL | 0 refills | Status: DC
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
## 2023-03-30 NOTE — Patient Instructions (Addendum)
Thank you for coming in today.   Lets try adderall.  Plan for immediate release adderall 10.  Anticipate twice daily.   We can increase the dose or modify the duration if needed.   Let me know how this goes early next week or this week.   Mychart message would work ok.   Recheck in 1 month.

## 2023-04-02 ENCOUNTER — Encounter: Payer: Self-pay | Admitting: Family Medicine

## 2023-04-27 ENCOUNTER — Encounter: Payer: BC Managed Care – PPO | Admitting: Family Medicine

## 2023-05-13 DIAGNOSIS — F3181 Bipolar II disorder: Secondary | ICD-10-CM | POA: Diagnosis not present

## 2023-05-13 DIAGNOSIS — F4310 Post-traumatic stress disorder, unspecified: Secondary | ICD-10-CM | POA: Diagnosis not present

## 2023-06-02 DIAGNOSIS — Z125 Encounter for screening for malignant neoplasm of prostate: Secondary | ICD-10-CM | POA: Diagnosis not present

## 2023-06-02 DIAGNOSIS — Z13220 Encounter for screening for lipoid disorders: Secondary | ICD-10-CM | POA: Diagnosis not present

## 2023-06-02 DIAGNOSIS — Z0000 Encounter for general adult medical examination without abnormal findings: Secondary | ICD-10-CM | POA: Diagnosis not present

## 2023-10-21 ENCOUNTER — Emergency Department (HOSPITAL_COMMUNITY): Payer: BC Managed Care – PPO

## 2023-10-21 ENCOUNTER — Other Ambulatory Visit: Payer: Self-pay

## 2023-10-21 ENCOUNTER — Emergency Department (HOSPITAL_COMMUNITY)
Admission: EM | Admit: 2023-10-21 | Discharge: 2023-10-22 | Disposition: A | Discharge status: Home / Self Care | Payer: BC Managed Care – PPO | Attending: Emergency Medicine | Admitting: Emergency Medicine

## 2023-10-21 DIAGNOSIS — R079 Chest pain, unspecified: Secondary | ICD-10-CM | POA: Diagnosis not present

## 2023-10-21 DIAGNOSIS — R509 Fever, unspecified: Secondary | ICD-10-CM | POA: Diagnosis not present

## 2023-10-21 DIAGNOSIS — R0789 Other chest pain: Secondary | ICD-10-CM | POA: Diagnosis not present

## 2023-10-21 DIAGNOSIS — Z20822 Contact with and (suspected) exposure to covid-19: Secondary | ICD-10-CM | POA: Diagnosis not present

## 2023-10-21 DIAGNOSIS — B349 Viral infection, unspecified: Secondary | ICD-10-CM | POA: Insufficient documentation

## 2023-10-21 LAB — RESP PANEL BY RT-PCR (RSV, FLU A&B, COVID)  RVPGX2
Influenza A by PCR: NEGATIVE
Influenza B by PCR: NEGATIVE
Resp Syncytial Virus by PCR: NEGATIVE
SARS Coronavirus 2 by RT PCR: NEGATIVE

## 2023-10-21 LAB — CBC
HCT: 42.4 % (ref 39.0–52.0)
Hemoglobin: 15.1 g/dL (ref 13.0–17.0)
MCH: 30.5 pg (ref 26.0–34.0)
MCHC: 35.6 g/dL (ref 30.0–36.0)
MCV: 85.7 fL (ref 80.0–100.0)
Platelets: 183 10*3/uL (ref 150–400)
RBC: 4.95 MIL/uL (ref 4.22–5.81)
RDW: 12.2 % (ref 11.5–15.5)
WBC: 9.1 10*3/uL (ref 4.0–10.5)
nRBC: 0 % (ref 0.0–0.2)

## 2023-10-21 LAB — TROPONIN I (HIGH SENSITIVITY)
Troponin I (High Sensitivity): 6 ng/L (ref <18)
Troponin I (High Sensitivity): 6 ng/L (ref <18)

## 2023-10-21 LAB — URINALYSIS, ROUTINE W REFLEX MICROSCOPIC
Bilirubin Urine: NEGATIVE
Glucose, UA: NEGATIVE mg/dL
Hgb urine dipstick: NEGATIVE
Ketones, ur: 20 mg/dL — AB
Leukocytes,Ua: NEGATIVE
Nitrite: NEGATIVE
Protein, ur: NEGATIVE mg/dL
Specific Gravity, Urine: 1.015 (ref 1.005–1.030)
pH: 5 (ref 5.0–8.0)

## 2023-10-21 LAB — BASIC METABOLIC PANEL
Anion gap: 11 (ref 5–15)
BUN: 10 mg/dL (ref 6–20)
CO2: 24 mmol/L (ref 22–32)
Calcium: 8.9 mg/dL (ref 8.9–10.3)
Chloride: 96 mmol/L — ABNORMAL LOW (ref 98–111)
Creatinine, Ser: 1.26 mg/dL — ABNORMAL HIGH (ref 0.61–1.24)
GFR, Estimated: >60 mL/min (ref >60)
Glucose, Bld: 123 mg/dL — ABNORMAL HIGH (ref 70–99)
Potassium: 4 mmol/L (ref 3.5–5.1)
Sodium: 131 mmol/L — ABNORMAL LOW (ref 135–145)

## 2023-10-21 LAB — D-DIMER, QUANTITATIVE: D-Dimer, Quant: 0.36 ug{FEU}/mL (ref 0.00–0.50)

## 2023-10-21 MED ORDER — ACETAMINOPHEN 500 MG PO TABS
1000.0000 mg | ORAL_TABLET | Freq: Once | ORAL | Status: AC
  Administered 2023-10-21: 1000 mg via ORAL
  Filled 2023-10-21: qty 2

## 2023-10-21 MED ORDER — ONDANSETRON 4 MG PO TBDP
4.0000 mg | ORAL_TABLET | Freq: Once | ORAL | Status: AC
  Administered 2023-10-21: 4 mg via ORAL
  Filled 2023-10-21: qty 1

## 2023-10-21 MED ORDER — ONDANSETRON HCL 4 MG/2ML IJ SOLN
4.0000 mg | Freq: Once | INTRAMUSCULAR | Status: DC
  Filled 2023-10-21: qty 2

## 2023-10-21 NOTE — ED Provider Notes (Signed)
[place] EMERGENCY DEPARTMENT AT [facility] Provider Note   CSN: [id number] Arrival date & time: 10/21/23  2008     History  Chief Complaint  Patient presents with   Chest Pain    [name] is a 62 y.o. male.  The history is provided by the patient and medical records. No language interpreter was used.  Chest Pain    62 year old male with history of bipolar, no history of tobacco use, presenting to ED with complaints of chest pain.  Patient states he went to his sail boat with his daughter yesterday and was outside for most of the day.  Afterward he states he felt very fatigued and went home.  At night he began to have fever chills and bodyaches along with pain in his chest and lower back pain.  He has decrease in appetite, feeling a bit constipated, and overall not feeling well. Does report nausea without vomiting. He does not endorse any runny nose sneezing or coughing denies any significant shortness of breath.  He did endorse urinating more than usual but denies any burning sensation when urinating.  He denies any recent sick contact.  Denies significant cardiac history but however states that he has a strong family history of cardiac disease and he concerns him as he never had "heart pain" like this before.  Pain is waxing waning.  No specific treatment tried.  He denies alcohol or tobacco use denies a recent change in medication.  No abdominal pain.  Home Medications Prior to Admission medications   Medication Sig Start Date End Date Taking? Authorizing Provider  amphetamine-dextroamphetamine (ADDERALL) 10 MG tablet Take 1 tablet (10 mg total) by mouth 2 (two) times daily with a meal. 03/30/23   [name], MD  lamoTRIgine (LAMICTAL) 200 MG tablet Take 1 tablet (200 mg total) by mouth daily. 04/22/22   [name], MD  naproxen (NAPROSYN) 500 MG tablet Take 1 tablet (500 mg total) by mouth 2 (two) times daily with a meal. 02/20/22   [name], MD   QUEtiapine (SEROQUEL) 50 MG tablet Take 1-2 tablets (50-100 mg total) by mouth at bedtime. 03/11/22   [name], MD      Allergies    Penicillins    Review of Systems   Review of Systems  Cardiovascular:  Positive for chest pain.  All other systems reviewed and are negative.   Physical Exam Updated Vital Signs BP 127/81   Pulse 89   Temp 100 F (37.8 C) (Oral)   Resp 16   SpO2 100%  Physical Exam Vitals and nursing note reviewed.  Constitutional:      General: He is not in acute distress.    Appearance: He is well-developed.  HENT:     Head: Atraumatic.     Mouth/Throat:     Mouth: Mucous membranes are moist.  Eyes:     Conjunctiva/sclera: Conjunctivae normal.  Cardiovascular:     Rate and Rhythm: Normal rate and regular rhythm.     Pulses: Normal pulses.     Heart sounds: Normal heart sounds.  Pulmonary:     Effort: Pulmonary effort is normal.     Breath sounds: Normal breath sounds. No wheezing, rhonchi or rales.  Abdominal:     Palpations: Abdomen is soft.     Tenderness: There is no abdominal tenderness.  Musculoskeletal:        General: Normal range of motion.     Cervical back: Normal range of  motion and neck supple. No rigidity or tenderness.  Skin:    General: Skin is warm.     Findings: No rash.  Neurological:     Mental Status: He is alert. Mental status is at baseline.     ED Results / Procedures / Treatments   Labs (all labs ordered are listed, but only abnormal results are displayed) Labs Reviewed  BASIC METABOLIC PANEL - Abnormal; Notable for the following components:      Result Value   Sodium 131 (*)    Chloride 96 (*)    Glucose, Bld 123 (*)    Creatinine, Ser 1.26 (*)    All other components within normal limits  RESP PANEL BY RT-PCR (RSV, FLU A&B, COVID)  [id number]  CBC  D-DIMER, QUANTITATIVE  TROPONIN I (HIGH SENSITIVITY)  TROPONIN I (HIGH SENSITIVITY)    EKG EKG Interpretation Date/Time:  Thursday October 21 2023 20:38:33  EST Ventricular Rate:  103 PR Interval:  156 QRS Duration:  82 QT Interval:  310 QTC Calculation: 406 R Axis:   61  Text Interpretation: Sinus tachycardia Otherwise normal ECG No previous ECGs available Confirmed by Alvira Monday ([id number]) on 10/21/2023 9:12:28 PM  Radiology DG Chest 2 View  Result Date: 10/21/2023 CLINICAL DATA:  Chest pain. EXAM: CHEST - 2 VIEW COMPARISON:  None Available. FINDINGS: The heart size and mediastinal contours are within normal limits. No consolidation, effusion, or pneumothorax. Degenerative changes are present in the thoracic spine. No acute osseous abnormality. IMPRESSION: No active cardiopulmonary disease. Electronically Signed   By: [name] M.D.   On: 10/21/2023 21:52    Procedures Procedures    Medications Ordered in ED Medications  acetaminophen (TYLENOL) tablet 1,000 mg (1,000 mg Oral Given 10/21/23 2321)  ondansetron (ZOFRAN-ODT) disintegrating tablet 4 mg (4 mg Oral Given 10/21/23 2321)    ED Course/ Medical Decision Making/ A&P                                 Medical Decision Making Amount and/or Complexity of Data Reviewed Labs: ordered. Radiology: ordered.  Risk OTC drugs. Prescription drug management.   BP 127/81   Pulse 89   Temp 100 F (37.8 C) (Oral)   Resp 16   SpO2 100%   41:5 PM  62 year old male with history of bipolar, no history of tobacco use, presenting to ED with complaints of chest pain.  Patient states he went to his sail boat with his daughter yesterday and was outside for most of the day.  Afterward he states he felt very fatigued and went home.  At night he began to have fever chills and bodyaches along with pain in his chest and lower back pain.  He has decrease in appetite, feeling a bit constipated, and overall not feeling well.  He does not endorse any runny nose sneezing or coughing denies any significant shortness of breath.  He did endorse urinating more than usual but denies any burning sensation  when urinating.  He denies any recent sick contact.  Denies significant cardiac history but however states that he has a strong family history of cardiac disease and he concerns him as he never had "heart pain" like this before.  Pain is waxing waning.  No specific treatment tried.  He denies alcohol or tobacco use denies a recent change in medication.  No abdominal pain.  Exam overall reassuring.  Patient is mentating appropriately.  He does  not exhibit any nuchal rigidity.  Heart with normal rate and rhythm, lungs clear to auscultation bilaterally abdomen is soft and nontender equal strength throughout, no concerning rash on the body.  Skin is warm to the touch.  -Labs ordered, independently viewed and interpreted by me.  Labs remarkable for Cr. 1.26, pt given electrolytes drink as he does tolerates PO.   -The patient was maintained on a cardiac monitor.  I personally viewed and interpreted the cardiac monitored which showed an underlying rhythm of: sinus tachycardia -Imaging independently viewed and interpreted by me and I agree with radiologist's interpretation.  Result remarkable for CXR without concerning finding -This patient presents to the ED for concern of chest pain, this involves an extensive number of treatment options, and is a complaint that carries with it a high risk of complications and morbidity.  The differential diagnosis includes viral illness, pericarditis, gerd, msk, pna, pe, dissection, pancreatitis, appendicitis, colitis -Co morbidities that complicate the patient evaluation includes bipolar -Treatment includes tylenol, zofran -Reevaluation of the patient after these medicines showed that the patient improved -PCP office notes or outside notes reviewed -Escalation to admission/observation considered: patients feels much better, is comfortable with discharge, and will follow up with PCP -Prescription medication considered, patient comfortable with tylenol, zofran -Social  Determinant of Health considered   Negative D-dimer, low suspicion for PE.  Patient has heart score of 1, low suspicion for ACS.  Symptoms not consistent with dissection.  He has a fairly benign abdominal exam I have low suspicion for acute abdominal pathology causing his symptoms.  I suspect this is likely to be a viral etiology.  I have low suspicion for bacterial meningitis as patient without any nuchal rigidity.         Final Clinical Impression(s) / ED Diagnoses Final diagnoses:  Viral illness  Nonspecific chest pain    Rx / DC Orders ED Discharge Orders          Ordered    acetaminophen (TYLENOL) 500 MG tablet  Every 6 hours PRN        10/22/23 0013    ondansetron (ZOFRAN) 4 MG tablet  Every 8 hours PRN        10/22/23 0013              Fayrene Helper, PA-C 10/22/23 0017    Sabas Sous, MD 10/22/23 [phone]

## 2023-10-21 NOTE — ED Notes (Signed)
Patient evaluated by dr. Denton Lank at triage .

## 2023-10-21 NOTE — ED Triage Notes (Signed)
Patient reports intermittent left chest pain with mild SOB and nausea / upper back pain onset yesterday .

## 2023-10-22 MED ORDER — ACETAMINOPHEN 500 MG PO TABS: 1000.0000 mg | ORAL_TABLET | Freq: Four times a day (QID) | ORAL | 0 refills | Status: AC | PRN

## 2023-10-22 MED ORDER — ONDANSETRON HCL 4 MG PO TABS: 4.0000 mg | ORAL_TABLET | Freq: Three times a day (TID) | ORAL | 0 refills | Status: AC | PRN

## 2023-10-22 NOTE — Discharge Instructions (Addendum)
You have been for your symptoms.  I suspect your symptoms are likely due to a viral infection.  You may take Tylenol as needed for fever and take Zofran as needed for nausea.  If you develop abdominal pain or if you have any concern please return to the ER for further assessment.  Otherwise, your symptom is not consistent with a heart attack.

## 2023-11-03 DIAGNOSIS — J40 Bronchitis, not specified as acute or chronic: Secondary | ICD-10-CM | POA: Diagnosis not present

## 2023-11-03 DIAGNOSIS — R059 Cough, unspecified: Secondary | ICD-10-CM | POA: Diagnosis not present

## 2023-11-03 DIAGNOSIS — R0981 Nasal congestion: Secondary | ICD-10-CM | POA: Diagnosis not present

## 2023-11-03 DIAGNOSIS — J329 Chronic sinusitis, unspecified: Secondary | ICD-10-CM | POA: Diagnosis not present
